# Patient Record
Sex: Female | Born: 1995 | Race: White | Hispanic: No | Smoking: Never smoker
Health system: Southern US, Community
[De-identification: ages and names within clinical notes are randomized; demographics above are authoritative.]

## PROBLEM LIST (undated history)

## (undated) DIAGNOSIS — F419 Anxiety disorder, unspecified: Secondary | ICD-10-CM

## (undated) DIAGNOSIS — F329 Major depressive disorder, single episode, unspecified: Secondary | ICD-10-CM

## (undated) DIAGNOSIS — K439 Ventral hernia without obstruction or gangrene: Secondary | ICD-10-CM

## (undated) DIAGNOSIS — G4711 Idiopathic hypersomnia with long sleep time: Principal | ICD-10-CM

## (undated) DIAGNOSIS — G43909 Migraine, unspecified, not intractable, without status migrainosus: Secondary | ICD-10-CM

## (undated) DIAGNOSIS — G4719 Other hypersomnia: Secondary | ICD-10-CM

## (undated) DIAGNOSIS — F319 Bipolar disorder, unspecified: Secondary | ICD-10-CM

## (undated) HISTORY — DX: Other hypersomnia: G47.19

## (undated) HISTORY — DX: Migraine, unspecified, not intractable, without status migrainosus: G43.909

## (undated) HISTORY — DX: Major depressive disorder, single episode, unspecified: F32.9

## (undated) HISTORY — DX: Anxiety disorder, unspecified: F41.9

## (undated) HISTORY — DX: Bipolar disorder, unspecified: F31.9

## (undated) HISTORY — DX: Idiopathic hypersomnia with long sleep time: G47.11

---

## 2013-10-10 ENCOUNTER — Emergency Department: Payer: Self-pay | Admitting: Emergency Medicine

## 2013-10-10 LAB — DRUG SCREEN, URINE
AMPHETAMINES, UR SCREEN: NEGATIVE (ref ?–1000)
BARBITURATES, UR SCREEN: NEGATIVE (ref ?–200)
Benzodiazepine, Ur Scrn: NEGATIVE (ref ?–200)
Cannabinoid 50 Ng, Ur ~~LOC~~: NEGATIVE (ref ?–50)
Cocaine Metabolite,Ur ~~LOC~~: NEGATIVE (ref ?–300)
MDMA (Ecstasy)Ur Screen: POSITIVE (ref ?–500)
Methadone, Ur Screen: NEGATIVE (ref ?–300)
Opiate, Ur Screen: NEGATIVE (ref ?–300)
Phencyclidine (PCP) Ur S: NEGATIVE (ref ?–25)
TRICYCLIC, UR SCREEN: NEGATIVE (ref ?–1000)

## 2013-10-10 LAB — URINALYSIS, COMPLETE
BACTERIA: NONE SEEN
Bilirubin,UR: NEGATIVE
GLUCOSE, UR: NEGATIVE mg/dL (ref 0–75)
Ketone: NEGATIVE
Leukocyte Esterase: NEGATIVE
Nitrite: NEGATIVE
PH: 6 (ref 4.5–8.0)
Protein: NEGATIVE
RBC,UR: 294 /HPF (ref 0–5)
Specific Gravity: 1.021 (ref 1.003–1.030)
Squamous Epithelial: 2
WBC UR: 3 /HPF (ref 0–5)

## 2013-10-10 LAB — COMPREHENSIVE METABOLIC PANEL
ALK PHOS: 44 U/L — AB
ANION GAP: 4 — AB (ref 7–16)
Albumin: 3.5 g/dL — ABNORMAL LOW (ref 3.8–5.6)
BILIRUBIN TOTAL: 0.5 mg/dL (ref 0.2–1.0)
BUN: 12 mg/dL (ref 7–18)
Calcium, Total: 8.8 mg/dL — ABNORMAL LOW (ref 9.0–10.7)
Chloride: 107 mmol/L (ref 98–107)
Co2: 29 mmol/L (ref 21–32)
Creatinine: 0.71 mg/dL (ref 0.60–1.30)
EGFR (African American): >60
GLUCOSE: 91 mg/dL (ref 65–99)
OSMOLALITY: 279 (ref 275–301)
POTASSIUM: 4.2 mmol/L (ref 3.5–5.1)
SGOT(AST): 27 U/L — ABNORMAL HIGH (ref 0–26)
SGPT (ALT): 18 U/L (ref 12–78)
SODIUM: 140 mmol/L (ref 136–145)
Total Protein: 7 g/dL (ref 6.4–8.6)

## 2013-10-10 LAB — ETHANOL
Ethanol %: <0.003 % (ref 0.000–0.080)
Ethanol: <3 mg/dL

## 2013-10-10 LAB — ACETAMINOPHEN LEVEL: Acetaminophen: <2 ug/mL

## 2013-10-10 LAB — CBC
HCT: 40.7 % (ref 35.0–47.0)
HGB: 14 g/dL (ref 12.0–16.0)
MCH: 32.8 pg (ref 26.0–34.0)
MCHC: 34.4 g/dL (ref 32.0–36.0)
MCV: 95 fL (ref 80–100)
PLATELETS: 269 10*3/uL (ref 150–440)
RBC: 4.27 10*6/uL (ref 3.80–5.20)
RDW: 13.2 % (ref 11.5–14.5)
WBC: 5.2 10*3/uL (ref 3.6–11.0)

## 2013-10-10 LAB — SALICYLATE LEVEL: Salicylates, Serum: <1.7 mg/dL

## 2014-08-02 ENCOUNTER — Encounter: Payer: Self-pay | Admitting: Neurology

## 2014-08-02 ENCOUNTER — Ambulatory Visit (INDEPENDENT_AMBULATORY_CARE_PROVIDER_SITE_OTHER): Payer: BLUE CROSS/BLUE SHIELD | Admitting: Neurology

## 2014-08-02 VITALS — BP 105/64 | HR 91 | Resp 12 | Ht 60.5 in | Wt 110.0 lb

## 2014-08-02 DIAGNOSIS — F329 Major depressive disorder, single episode, unspecified: Secondary | ICD-10-CM

## 2014-08-02 DIAGNOSIS — G4711 Idiopathic hypersomnia with long sleep time: Secondary | ICD-10-CM

## 2014-08-02 DIAGNOSIS — R5383 Other fatigue: Secondary | ICD-10-CM | POA: Insufficient documentation

## 2014-08-02 DIAGNOSIS — F3340 Major depressive disorder, recurrent, in remission, unspecified: Secondary | ICD-10-CM | POA: Insufficient documentation

## 2014-08-02 DIAGNOSIS — F32A Depression, unspecified: Secondary | ICD-10-CM

## 2014-08-02 DIAGNOSIS — G4719 Other hypersomnia: Secondary | ICD-10-CM

## 2014-08-02 HISTORY — DX: Other hypersomnia: G47.19

## 2014-08-02 HISTORY — DX: Depression, unspecified: F32.A

## 2014-08-02 HISTORY — DX: Idiopathic hypersomnia with long sleep time: G47.11

## 2014-08-02 NOTE — Patient Instructions (Signed)
Hypersomnia Hypersomnia usually brings recurrent episodes of excessive daytime sleepiness or prolonged nighttime sleep. It is different than feeling tired due to lack of or interrupted sleep at night. People with hypersomnia are compelled to nap repeatedly during the day. This is often at inappropriate times such as:  At work.  During a meal.  In conversation. These daytime naps usually provide no relief. This disorder typically affects adolescents and young adults. CAUSES  This condition may be caused by:  Another sleep disorder (such as narcolepsy or sleep apnea).  Dysfunction of the autonomic nervous system.  Drug or alcohol abuse.  A physical problem, such as:  A tumor.  Head trauma. This is damage caused by an accident.  Injury to the central nervous system.  Certain medications, or medicine withdrawal.  Medical conditions may contribute to the disorder, including:  Multiple sclerosis.  Depression.  Encephalitis.  Epilepsy.  Obesity.  Some people appear to have a genetic predisposition to this disorder. In others, there is no known cause. SYMPTOMS   Patients often have difficulty waking from a long sleep. They may feel dazed or confused.  Other symptoms may include:  Anxiety.  Increased irritation (inflammation).  Decreased energy.  Restlessness.  Slow thinking.  Slow speech.  Loss of appetite.  Hallucinations.  Memory difficulty.  Tremors, Tics.  Some patients lose the ability to function in family, social, occupational, or other settings. TREATMENT  Treatment is symptomatic in nature. Stimulants and other drugs may be used to treat this disorder. Changes in behavior may help. For example, avoid night work and social activities that delay bed time. Changes in diet may offer some relief. Patients should avoid alcohol and caffeine. PROGNOSIS  The likely outcome (prognosis) for persons with hypersomnia depends on the cause of the disorder.  The disorder itself is not life threatening. But it can have serious consequences. For example, automobile accidents can be caused by falling asleep while driving. The attacks usually continue indefinitely. Document Released: 05/24/2002 Document Revised: 08/26/2011 Document Reviewed: 04/27/2008 ExitCare Patient Information 2015 [employer]. This information is not intended to replace advice given to you by your health care provider. Make sure you discuss any questions you have with your health care provider.  

## 2014-08-02 NOTE — Progress Notes (Signed)
SLEEP MEDICINE CLINIC   Provider:  Larey Seat, M D  Referring Provider: Noemi Chapel, NP Primary Care Physician:  No primary care provider on file.  Chief Complaint  Patient presents with  . NP P[facility] Sleep consult (sleeping alot, tired all the time)    Rm 11, alone    HPI:  Elizabeth Velez is a 19 y.o. female seen here as a referral  from Dr. 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 She would describe herself as excessively daytime sleepy and fatigued.  She endorsed the Epworth Sleepiness Scale at 18 points the fatigue severity score at 41 points, and the depression scale at 7 points. Also she has been treated by Noemi Chapel for depression and anxiety she still feels a death interest in activities and that she often needs more sleep. She is sleepy in daytime. She also has a diagnosis of migraines, these can be slept off meaning that the migraines do not arise out of sleep or wake the patient from sleep. She only sporadically and not regularly takes caffeine and she is currently on 3 medications that can affect her sleep pattern. Prozac 30 minute times a day, Wellbutrin 100 mg per day and Abilify 2.1 mg per day she's also on a birth control pill.  The patient has struggled with depression since her junior year and at the same time was treated for depression. She reports that she usually has no trouble to go to sleep in the first place but sometimes wakes up in the early morning hours. It is also not typical for her to have difficulties to re-initiate sleep.  She is usually going to bed around 7:00 for the last couple of months. Her bed room is cool, quiet and dark.  She sleeps alone, no pet.   She will fall asleep very promptly at this early evening time. She will sleep through the night until 8 or 9 AM depending  on her classes and assignments was a day. If she sleeps through the night she would obtain more than 12 hours of sleep yet wakes up unrefreshed and unrestored. There is no nocturia and no known interruptions of sleep  at night. She will rely on an alarm to wake up,  she feels she would otherwise oversleep. In addition to these very long nocturnal sleep periods there are  also daytime naps. She reports taking naps at about every other day for duration of over 1 hour, usually at a time after lunch.this adds to 13 hours of sleep on an average day. The naps are not refreshing her , either.   The patient's mother has sleep apnea and restless leg syndrome, due to claustrophobia she is unable to tolerate CPAP. The patient reports a history of nightmares and still at times has nightmarish dreams. She is not sleep walking and had no night terrors in childhood that she could recall. She was never told that she snores, she does not wake up with a dry mouth in the morning, very rarely will she have a headache in the morning. She has never undergone tonsillectomy adenoidectomy sinus surgeries or neck surgeries.        Review of Systems: Out of a complete 14 system review, the patient complains of only the following symptoms, and all other reviewed systems are negative.  hypersomnia with prolonged sleep time.   Epworth score  18 , Fatigue severity score 41   , depression score 7 . No dream intrusions, vivid dreams, no sleep paralysis, no cataplexy.    History   Social History  . Marital Status: Single    Spouse Name: N/A  . Number of Children: 0  . Years of Education: college   Occupational History  . Student at [employer] History Main Topics  . Smoking status: Never Smoker   . Smokeless tobacco: Not on file  . Alcohol Use: No  . Drug Use: No  . Sexual Activity: Not on file   Other Topics Concern  . Not on file   Social History Narrative   Caffeine, sporadic    Family History    Problem Relation Age of Onset  . Alcohol abuse Paternal Aunt   . Depression Maternal Aunt   . Alzheimer's disease Maternal Grandmother     Past Medical History  Diagnosis Date  . Migraine   . Depression   . Anxiety     History reviewed. No pertinent past surgical history.  Current Outpatient Prescriptions  Medication Sig Dispense Refill  . ARIPiprazole (ABILIFY) 5 MG tablet Take 2.5 mg by mouth daily.    Marland Kitchen buPROPion (WELLBUTRIN) 100 MG tablet Take 100 mg by mouth daily.    Marland Kitchen desogestrel-ethinyl estradiol (APRI,EMOQUETTE,SOLIA) 0.15-30 MG-MCG tablet Take 1 tablet by mouth daily.    Marland Kitchen FLUoxetine (PROZAC) 10 MG capsule Take 30 mg by mouth daily.      No current facility-administered medications for this visit.    Allergies as of 08/02/2014 - Review Complete 08/02/2014  Allergen Reaction Noted  . Penicillins  08/02/2014  . Zithromax [azithromycin]  08/02/2014    Vitals: BP 105/64 mmHg  Pulse 91  Resp 12  Ht 5' 0.5" (1.537 m)  Wt 110 lb (49.896 kg)  BMI 21.12 kg/m2 Last Weight:  Wt Readings from Last 1 Encounters:  08/02/14 110 lb (49.896 kg)       Last Height:   Ht Readings from Last 1 Encounters:  08/02/14 5' 0.5" (1.537 m)    Physical exam:  General: The patient is awake, alert and appears not in acute distress. The patient is well groomed. Head: Normocephalic, atraumatic. Neck is supple. Mallampati 1 ,  neck circumference: 13 . Nasal airflow unrestricted , TMJ is not  evident . Retrognathia is seen. She wore retainers in childhood.  Cardiovascular:  Regular rate and rhythm, without  murmurs or carotid bruit, and without distended neck veins. Respiratory: Lungs are clear to auscultation. Skin:  Without evidence of edema, or rash Trunk:  normal posture.  Neurologic exam : The patient is awake and alert, oriented to place and time.   Memory subjective described as intact. There is a normal attention span & concentration ability.  Speech is fluent without  dysarthria, dysphonia or aphasia. Mood and affect are appropriate.  Cranial nerves: Pupils are equal and briskly reactive to light. Funduscopic exam without evidence of pallor or edema. Extraocular movements  in vertical and horizontal planes intact and without nystagmus. Visual fields by finger perimetry are intact. Hearing to finger rub intact.  Facial sensation intact to fine touch. Facial motor strength is symmetric and tongue and uvula move midline.  Motor exam:  Normal tone ,muscle bulk and symmetric ,strength in all extremities.  Sensory:  Fine touch, pinprick and vibration were tested in all extremities. Proprioception is normal. Reports numbness in the AM, on  ocassion.   Coordination: Rapid alternating movements in the fingers/hands is normal. Finger-to-nose maneuver normal without evidence of ataxia, dysmetria or tremor.   Gait and station: Patient walks without assistive device and is able unassisted to climb up to the exam table. Strength within normal limits.  Stance is stable and normal.  Romberg testing is negative.  Deep tendon reflexes: in the  upper and lower extremities are symmetric and intact.    Assessment:  After physical and neurologic examination, review of laboratory studies, imaging, neurophysiology testing and pre-existing records, assessment is   1) hypersomnia, at this time unexplained. Narcoleptic features are not present except for the very high Epworth score. The patient denied sleep paralysis, dream intrusion, cataplexy. Differential diagnosis is discussed as possible depression related hypersomnia, qualified as hypersomnia with prolonged sleep time. 2) rule out apnea or upper airway resistance he syndrome, anatomically this patient is not at risk for either of these. She has never been told that she snores and nobody has observed a witnessed any apneas, but she does live alone. 3) she reports no restless legs, she does not frequently wake up, but she does  have some nightmare content in her dreams which can also be depression related. Review of her medication shows several medications that are REM suppressant. Therefore an MS LT would not be indicated or not valid for this patient.   The patient was advised of the nature of the diagnosed sleep disorder , the treatment options and risks for general a health and wellness arising from not treating the condition. Visit duration was 40 minutes. More than 50%of the face to face time was used to answer questions, and get more detailed sleep history to help in establishing the differential diagnosis.   Plan:  Treatment plan and additional workup : I will invite the patient for a attended polysomnography to see her REM sleep phases and the relation to possible sleep arousals. In addition I want to make sure that it is not a perception of sleep that affects the patient. I will not follow this study was an MS LT. I will order an HLA test to establish a differential.  I thank Noemi Chapel for allowing me to participate in this pleasant patient's care, and encourage her to call me if there are any additional questions.  We are mainly planning to rulie out an organic sleep disorder at the origin of hypersomnia with prolonged sleep time.       Asencion Partridge Briscoe Daniello MD  08/02/2014

## 2014-08-10 ENCOUNTER — Telehealth: Payer: Self-pay | Admitting: Neurology

## 2014-08-10 NOTE — Telephone Encounter (Signed)
Negative for narcolepsy.

## 2014-08-10 NOTE — Telephone Encounter (Signed)
Pt is calling to get lab results.  Please call and advise. °

## 2014-08-10 NOTE — Telephone Encounter (Signed)
Called patient and spoke to her and told her normal labs. Patient understood Narcolepsy.

## 2014-08-29 ENCOUNTER — Ambulatory Visit (INDEPENDENT_AMBULATORY_CARE_PROVIDER_SITE_OTHER): Payer: BLUE CROSS/BLUE SHIELD | Admitting: Neurology

## 2014-08-29 ENCOUNTER — Telehealth: Payer: Self-pay | Admitting: Neurology

## 2014-08-29 DIAGNOSIS — G4719 Other hypersomnia: Secondary | ICD-10-CM

## 2014-08-29 DIAGNOSIS — G4710 Hypersomnia, unspecified: Secondary | ICD-10-CM

## 2014-08-29 DIAGNOSIS — G4711 Idiopathic hypersomnia with long sleep time: Secondary | ICD-10-CM

## 2014-08-29 DIAGNOSIS — F329 Major depressive disorder, single episode, unspecified: Secondary | ICD-10-CM

## 2014-08-29 DIAGNOSIS — F32A Depression, unspecified: Secondary | ICD-10-CM

## 2014-08-29 NOTE — Telephone Encounter (Signed)
Called and left VM for return call back for appointment confirmation of tonight's study.

## 2014-08-29 NOTE — Sleep Study (Signed)
Please see the scanned sleep study interpretation located in the Procedure tab within the Chart Review section. 

## 2014-09-07 ENCOUNTER — Telehealth: Payer: Self-pay | Admitting: *Deleted

## 2014-09-07 ENCOUNTER — Encounter: Payer: Self-pay | Admitting: Neurology

## 2014-09-07 NOTE — Telephone Encounter (Signed)
Patient was contacted and provided the results of her sleep study.  Elizabeth SavageLisa Poulos was faxed a copy of the report.  Patient was informed that no further follow up was needed in the sleep clinic.

## 2014-10-08 NOTE — Consult Note (Signed)
PATIENT NAME:  Elizabeth Velez, Elizabeth Velez MR#:  [id number] DATE OF BIRTH:  03-04-1996  DATE OF CONSULTATION:  10/10/2013  REFERRING PHYSICIAN:   CONSULTING PHYSICIAN:  Elizabeth Dicenso B. Jennet Maduro, MD  REFERRING PHYSICIAN: Janalyn Harder, MD   CONSULTING PHYSICIAN: Elizabeth Linea, MD   REASON FOR CONSULTATION: To evaluate a suicidal patient.   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   CHIEF COMPLAINT: "I lost it."   HISTORY OF PRESENT ILLNESS: The patient has been in the care of [facility] psychiatrist and [facility].  She felt that she was stable on her current regimen of medications. She came to the Emergency Room with a roommate complaining of suicidal thoughts with a plan to overdose on medication. She specifically named Excedrin to overdose on. This morning, she broke up with her boyfriend. This is the second relationship that she lost this year. She feels sad, discouraged, worthless, hopeless. She feels inadequate, guilty. Apparently, the reason why she lost this boyfriend is that she is not a drinker. She lives in a substance free dorm together with 7 other kids on [place] campus. She does not fit socially.  She did not get to sorority. She is unable to maintain a romantic relationship. She thinks that it is all due to the fact that she is not engaging in regular campus activities, mostly substance use. She does well in school. She is in early education class. She is a Medical laboratory scientific officer. Grades are excellent, but she does not fit into campus community. She still likes the academic program and is determined to continue. She denies psychotic symptoms. Denies excessive anxiety, denies symptoms suggestive of bipolar mania. As above, no substance use or alcohol.   PAST PSYCHIATRIC HISTORY: She has never been hospitalized. No suicide attempts. She has an outpatient psychiatrist.   FAMILY PSYCHIATRIC HISTORY: Mother with depression, anxiety, and history  of suicide attempts.   PAST MEDICAL HISTORY: None.   ALLERGIES: No known drug allergies.   MEDICATIONS ON ADMISSION: Prozac, Wellbutrin,  Xanax. I am uncertain of her dosage.   SOCIAL HISTORY: As above, she is from [place]. She lives on campus, is a sophomore at [employer]. She has an older sister who studies at the [employer].   REVIEW OF SYSTEMS:  CONSTITUTIONAL: No fevers or chills. No weight changes.  EYES: No double or blurred vision. EARS, NOSE, AND THROAT:  No hearing loss.  RESPIRATORY: No shortness of breath or cough.  CARDIOVASCULAR: No chest pain or orthopnea.  GASTROINTESTINAL: No abdominal pain, nausea, vomiting, or diarrhea.  GENITOURINARY: No incontinence or frequency.  ENDOCRINE: No heat or cold intolerance.  LYMPHATIC: No anemia or easy bruising.  INTEGUMENTARY: No acne or rash.  MUSCULOSKELETAL: No muscle or joint pain.  NEUROLOGIC: No tingling or weakness.  PSYCHIATRIC: See history of present illness for details.   PHYSICAL EXAMINATION:  VITAL SIGNS: Blood pressure 111/55, pulse 92, respirations 16, temperature 98.5.  GENERAL: This is a petite, young girl, looking younger than stated age, tearful. The rest of the physical examination is deferred to her primary attending.   LABORATORY DATA: Chemistries are within normal limits. Blood alcohol level is zero. LFTs within normal limits, except for AST of 27. Urine tox screen negative for substances. CBC within normal limits. Urinalysis is not suggestive of urinary tract infection. Serum acetaminophen and salicylates are low. Urine pregnancy test is negative.   MENTAL STATUS EXAMINATION ON ADMISSION: The patient is examined in the hallway of Emergency  Room. She is alert and oriented to person, place, time, and situation. She is pleasant, polite, and cooperative. Rather tearful and timid. She maintains good eye contact. Her speech is soft. Mood is depressed with sad affect. She is well groomed and casually  dressed. Thought process is logical and goal oriented. She endorses thoughts of hurting herself, but now she feels that the danger is over as she had a plan to overdose on medications. There are no thoughts of hurting others. There are no delusions or paranoia. There are no auditory or visual hallucinations. Her cognition is grossly intact. Registration  record showed, short-term and long-term memory are good. She is a good historian. She is quite intelligent with good fund of knowledge. Her insight and judgment are good.   DIAGNOSES:  AXIS I: Major depressive disorder, recurrent, severe. Anxiety disorder, not otherwise specified.  AXIS II: Deferred.  AXIS III: Deferred.  AXIS IV: Relationship problems, social problems, primary support.  AXIS V: Global assessment of functioning 35.   PLAN: I do not want to discharge this young woman back to campus.  Her only support at this point in the evening is her roommate.  I do not think that this is fair to burden her friend with such responsibility.  The parents are in [place] and they both are to come.   I would like to admit this patient to psychiatry briefly but we have no beds.  We will place this patient in behavioral medicine unit in the Emergency Room for the night. I will re-evaluate the patient in the morning. We will hope that we will get in touch with her counselor on [facility] and that she would be discharged to care of her mental health professional. I believe that she will be able to return to school immediately. We will consult with the [place] authorities hours.  Our intake nurse will contact the mother. We have her phone number. I will probably prescribe trazodone for sleep tonight. I am uncertain of the doses of her medications. We will hold it for now. She can restart it in the morning. Page me if there is a problem.    ____________________________ Elizabeth Goodie Jennet Maduro, MD jbp:dd D: 10/10/2013 20:43:25 ET T: 10/11/2013 04:21:17  ET JOB#: [id number]  cc: Elizabeth Mcquiston B. Jennet Maduro, MD, <Dictator> Shari Prows MD ELECTRONICALLY SIGNED 10/13/2013 23:51

## 2014-10-08 NOTE — Consult Note (Signed)
Brief Consult Note: Diagnosis: MDD recurrent.   Patient was seen by consultant.   Consult note dictated.   Recommend further assessment or treatment.   Orders entered.   Discussed with Attending MD.   Comments: [name] has a h/o depression and anxiety here for suicidal thoughts after brake up with her BF of 1.5 months.  PLAN: 1. No beds in BMU.  2. Will admit to BHU overnight, reassess in the morning, [facility]  3. Intake nurse will call the mother, Elizabeth Velez.  4. will give trazodone for sleep.  Electronic Signatures for Addendum Section:  Kristine LineaPucilowska, Dwana Garin (MD) (Signed Addendum 26-Apr-15 22:48)  Intake nurse spoke with the mother who is comfortable with discharging Elizabeth Velez back to campus. [facility] administrator on call will pick the patient up fro ER. She will spend the night with her roommate. She will see [facility] therapist on Monday and [facility] psychiatrist on Thursday.   Electronic Signatures: Kristine LineaPucilowska, Ramel Tobon (MD)  (Signed 26-Apr-15 20:46)  Authored: Brief Consult Note   Last Updated: 26-Apr-15 22:48 by Kristine LineaPucilowska, Donita Newland (MD)

## 2015-03-29 ENCOUNTER — Ambulatory Visit (INDEPENDENT_AMBULATORY_CARE_PROVIDER_SITE_OTHER): Payer: 59 | Admitting: Family Medicine

## 2015-03-29 ENCOUNTER — Encounter: Payer: Self-pay | Admitting: Family Medicine

## 2015-03-29 VITALS — BP 102/72 | HR 104 | Temp 98.4°F | Resp 18 | Wt 112.3 lb

## 2015-03-29 DIAGNOSIS — Z13220 Encounter for screening for lipoid disorders: Secondary | ICD-10-CM

## 2015-03-29 DIAGNOSIS — R5383 Other fatigue: Secondary | ICD-10-CM | POA: Diagnosis not present

## 2015-03-29 DIAGNOSIS — R010 Benign and innocent cardiac murmurs: Secondary | ICD-10-CM

## 2015-03-29 DIAGNOSIS — Z3041 Encounter for surveillance of contraceptive pills: Secondary | ICD-10-CM

## 2015-03-29 DIAGNOSIS — Z136 Encounter for screening for cardiovascular disorders: Secondary | ICD-10-CM | POA: Diagnosis not present

## 2015-03-29 DIAGNOSIS — F3340 Major depressive disorder, recurrent, in remission, unspecified: Secondary | ICD-10-CM

## 2015-03-29 DIAGNOSIS — R011 Cardiac murmur, unspecified: Secondary | ICD-10-CM

## 2015-03-29 DIAGNOSIS — IMO0001 Reserved for inherently not codable concepts without codable children: Secondary | ICD-10-CM | POA: Insufficient documentation

## 2015-03-29 MED ORDER — DESOGESTREL-ETHINYL ESTRADIOL 0.15-30 MG-MCG PO TABS: 1.0000 | ORAL_TABLET | Freq: Every day | ORAL | Status: DC

## 2015-03-29 NOTE — Progress Notes (Signed)
Name: Elizabeth Velez   MRN: [id number]    DOB: Jun 04, 1996   Date:03/29/2015       Progress Note  Subjective  Chief Complaint  Chief Complaint  Patient presents with  . Establish Care  . Labs Only    patient is fasting and has not had any recent blood work  . Medication Refill    birth control  . Irregular Heart Beat    [facility] that she had "an extra flow at the end"    HPI  Elizabeth Velez is a 19 y.o. female here today to transition care of medical needs to a primary care provider. [facility] for acute symptoms of headache and fever on 03/08/15 and was told she had "an extra blood flow" on her cardiac exam. Her heart rate was 95 that day and blood pressure was 108/72 mmHG, temperature 98 degrees F. SPO2 98%, resp rate 18.  She denies palpitations, chest pain, new syncope, nausea, vomiting, worsening dyspnea, edema, dizziness, previous history of cardiac abnormalities. Her acute symptoms of headache and fever have since resolved.  She is also using birth control pills for contraceptive method, prevention of pregnancy and regulation of menses. Denies any side effects.   Patient has a history of depression. Symptoms in the past have been  depressed mood, difficulty concentrating, fatigue, feelings of worthlessness/guilt, hopelessness and insomnia. Onset was approximately several years ago, stable since that time.  She denies current suicidal and homicidal plan or intent.   Family history significant for anxiety and depression.Possible organic causes contributing are: none.  Risk factors: none. Current treatment includes Prozac 10 mg taking 3 tablets plus Rexulti 1 mg at bedtime and Wellbutrin SR 100 MG in the morning only (she had previously tried going up on the dose and it did not suit her well) and individual therapy.  She is requesting referral to a new therapist. She sees a Therapist, sports in [place] already. She  complains of the following side effects from the treatment: none.     Past Medical History  Diagnosis Date  . Migraine   . Depression   . Anxiety   . 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 Excessive daytime sleepiness 08/02/2014  . Depression 08/02/2014    Patient Active Problem List   Diagnosis Date Noted  . 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 Excessive daytime sleepiness 08/02/2014  . Depression 08/02/2014    Social History  Substance Use Topics  . Smoking status: Never Smoker   . Smokeless tobacco: Not on file  . Alcohol Use: 0.0 oz/week    0 Standard drinks or equivalent per week     Comment: only once a month     Current outpatient prescriptions:  .  Biotin 1000 MCG tablet, Take 1,000 mcg by mouth daily., Disp: , Rfl:  .  buPROPion (WELLBUTRIN) 100 MG tablet, Take 100 mg by mouth daily., Disp: , Rfl:  .  desogestrel-ethinyl estradiol (APRI,EMOQUETTE,SOLIA) 0.15-30 MG-MCG tablet, Take 1 tablet by mouth daily., Disp: , Rfl:  .  FLUoxetine (PROZAC) 10 MG capsule, Take 30 mg by mouth daily. , Disp: , Rfl:  .  Melatonin 10 MG TABS, Take 10 mg by mouth as needed., Disp: , Rfl:  .  REXULTI 1 MG TABS, Take 1 tablet by mouth at bedtime., Disp: , Rfl: 0  History reviewed. No pertinent past surgical history.  Family History  Problem Relation Age of Onset  . Alcohol abuse Paternal Aunt   . Depression Maternal Aunt   . Alzheimer's disease Maternal Grandmother   . Heart disease Maternal Grandfather     Allergies  Allergen Reactions  . Penicillins   . Zithromax [Azithromycin]      Review of Systems  CONSTITUTIONAL: No significant weight changes, fever, chills, weakness or fatigue.  HEENT:  - Eyes: No visual changes.  - Ears: No auditory changes. No pain.  - Nose: No sneezing, congestion, runny nose. - Throat: No sore throat. No changes in swallowing. SKIN: No rash or itching.  CARDIOVASCULAR: No chest pain, chest pressure  or chest discomfort. No palpitations or edema.  RESPIRATORY: No shortness of breath, cough or sputum.  GASTROINTESTINAL: No anorexia, nausea, vomiting. No changes in bowel habits. No abdominal pain or blood.  GENITOURINARY: No dysuria. No frequency. No discharge.  NEUROLOGICAL: No headache, dizziness, syncope, paralysis, ataxia, numbness or tingling in the extremities. No memory changes. No change in bowel or bladder control.  MUSCULOSKELETAL: No joint pain. No muscle pain. HEMATOLOGIC: No anemia, bleeding or bruising.  LYMPHATICS: No enlarged lymph nodes.  PSYCHIATRIC: No change in mood. No change in sleep pattern.  ENDOCRINOLOGIC: No reports of sweating, cold or heat intolerance. No polyuria or polydipsia.     Objective  BP 102/72 mmHg  Pulse 104  Temp(Src) 98.4 F (36.9 C) (Oral)  Resp 18  Wt 112 lb 4.8 oz (50.939 kg)  SpO2 96%  LMP 03/23/2015 (Approximate) Body mass index is 21.56 kg/(m^2).  Physical Exam  Constitutional: Patient appears well-developed and well-nourished. In no distress.  HEENT:  - Head: Normocephalic and atraumatic.  - Ears: Bilateral TMs gray, no erythema or effusion - Nose: Nasal mucosa moist - Mouth/Throat: Oropharynx is clear and moist. No tonsillar hypertrophy or erythema. No post nasal drainage.  - Eyes: Conjunctivae clear, EOM movements normal. PERRLA. No scleral icterus.  Neck: Normal range of motion. Neck supple. No JVD present. No thyromegaly present.  Cardiovascular: Normal rate, regular rhythm and normal heart sounds.  No murmur heard.  Pulmonary/Chest: Effort normal and breath sounds normal. No respiratory distress. Musculoskeletal: Normal range of motion bilateral UE and LE, no joint effusions. Peripheral vascular: Bilateral LE no edema. Neurological: CN II-XII grossly intact with no focal deficits. Alert and oriented to person, place, and time. Coordination, balance, strength, speech and gait are normal.  Skin: Skin is warm and dry. No rash  noted. No erythema.  Psychiatric: Patient has a normal mood and affect. Behavior is normal in office today. Judgment and thought content normal in office today.   Assessment & Plan  1. Heart murmur previously undiagnosed Not heard on exam today, EKG shows normal sinus rhythm with no stand out abberations.   - EKG 12-Lead - CBC with Differential/Platelet - Comprehensive metabolic panel - Lipid panel - TSH - Magnesium  2. Major depressive disorder, recurrent, in remission (HCC) Well controled.  - Ambulatory referral to Psychology  3. Other fatigue Likely due to mood disorder. Young female, good BMI, no abnormal physical findings. Will get blood work.  - CBC with Differential/Platelet - Comprehensive metabolic panel - Lipid panel - TSH - Magnesium - Ambulatory referral to Psychology  4. Encounter for cholesteral screening for cardiovascular disease  - Lipid panel  5. Oral contraceptive pill surveillance Patient has been counseled on birth control options today. We discussed risks and benefits of each available contraception. Counseled on risk for STDs not reduced by birth control use. Decided  on continuing oral contraceptives. The patient has been counseled on the proper usage, side effects and potential interactions of the new medication.  - desogestrel-ethinyl estradiol (APRI,EMOQUETTE,SOLIA) 0.15-30 MG-MCG tablet; Take 1 tablet by mouth daily.  Dispense: 3 Package; Refill: 3

## 2015-03-30 LAB — COMPREHENSIVE METABOLIC PANEL
ALT: 18 IU/L (ref 0–32)
AST: 25 IU/L (ref 0–40)
Albumin/Globulin Ratio: 1.7 (ref 1.1–2.5)
Albumin: 4.2 g/dL (ref 3.5–5.5)
Alkaline Phosphatase: 43 IU/L (ref 39–117)
BUN/Creatinine Ratio: 15 (ref 8–20)
BUN: 11 mg/dL (ref 6–20)
Bilirubin Total: 0.5 mg/dL (ref 0.0–1.2)
CO2: 23 mmol/L (ref 18–29)
CREATININE: 0.74 mg/dL (ref 0.57–1.00)
Calcium: 9.1 mg/dL (ref 8.7–10.2)
Chloride: 101 mmol/L (ref 97–108)
GFR calc Af Amer: 134 mL/min/{1.73_m2} (ref >59)
GFR calc non Af Amer: 116 mL/min/{1.73_m2} (ref >59)
GLOBULIN, TOTAL: 2.5 g/dL (ref 1.5–4.5)
GLUCOSE: 83 mg/dL (ref 65–99)
Potassium: 4.2 mmol/L (ref 3.5–5.2)
Sodium: 140 mmol/L (ref 134–144)
Total Protein: 6.7 g/dL (ref 6.0–8.5)

## 2015-03-30 LAB — CBC WITH DIFFERENTIAL/PLATELET
BASOS: 0 %
Basophils Absolute: 0 10*3/uL (ref 0.0–0.2)
EOS (ABSOLUTE): 0.1 10*3/uL (ref 0.0–0.4)
EOS: 1 %
HEMOGLOBIN: 14.1 g/dL (ref 11.1–15.9)
Hematocrit: 39.6 % (ref 34.0–46.6)
IMMATURE GRANULOCYTES: 0 %
Immature Grans (Abs): 0 10*3/uL (ref 0.0–0.1)
Lymphocytes Absolute: 1.5 10*3/uL (ref 0.7–3.1)
Lymphs: 39 %
MCH: 32.9 pg (ref 26.6–33.0)
MCHC: 35.6 g/dL (ref 31.5–35.7)
MCV: 93 fL (ref 79–97)
MONOCYTES: 7 %
Monocytes Absolute: 0.3 10*3/uL (ref 0.1–0.9)
NEUTROS ABS: 2 10*3/uL (ref 1.4–7.0)
NEUTROS PCT: 53 %
Platelets: 250 10*3/uL (ref 150–379)
RBC: 4.28 x10E6/uL (ref 3.77–5.28)
RDW: 12.4 % (ref 12.3–15.4)
WBC: 3.8 10*3/uL (ref 3.4–10.8)

## 2015-03-30 LAB — LIPID PANEL
CHOLESTEROL TOTAL: 147 mg/dL (ref 100–199)
Chol/HDL Ratio: 2.7 ratio units (ref 0.0–4.4)
HDL: 55 mg/dL (ref >39)
LDL CALC: 80 mg/dL (ref 0–99)
TRIGLYCERIDES: 61 mg/dL (ref 0–149)
VLDL Cholesterol Cal: 12 mg/dL (ref 5–40)

## 2015-03-30 LAB — TSH: TSH: 1.83 u[IU]/mL (ref 0.450–4.500)

## 2015-03-30 LAB — MAGNESIUM: MAGNESIUM: 2.1 mg/dL (ref 1.6–2.3)

## 2015-03-31 ENCOUNTER — Telehealth: Payer: Self-pay

## 2015-03-31 ENCOUNTER — Telehealth: Payer: Self-pay | Admitting: Family Medicine

## 2015-03-31 NOTE — Telephone Encounter (Signed)
Pt returning your call about her lab results.

## 2015-03-31 NOTE — Telephone Encounter (Signed)
Tried to contact this patient to review her labs, but there was no answer. A message was left for her to give us a call when she got the chance.

## 2015-03-31 NOTE — Telephone Encounter (Signed)
Patient mom called due to this patient not being able to answer her phone from poor connection. After she verified this patient's date of birth, normal labs were reviewed. Mom was told that a referral was placed for a evaluation from a psychologist and then she informed me that an appt has already been made.

## 2015-03-31 NOTE — Telephone Encounter (Signed)
Patient was informed via mom.

## 2015-10-27 ENCOUNTER — Ambulatory Visit (INDEPENDENT_AMBULATORY_CARE_PROVIDER_SITE_OTHER): Payer: 59 | Admitting: Family Medicine

## 2015-10-27 ENCOUNTER — Encounter: Payer: Self-pay | Admitting: Family Medicine

## 2015-10-27 VITALS — BP 104/76 | HR 98 | Temp 98.7°F | Resp 14 | Wt 106.4 lb

## 2015-10-27 DIAGNOSIS — R05 Cough: Secondary | ICD-10-CM

## 2015-10-27 DIAGNOSIS — J069 Acute upper respiratory infection, unspecified: Secondary | ICD-10-CM | POA: Diagnosis not present

## 2015-10-27 DIAGNOSIS — R059 Cough, unspecified: Secondary | ICD-10-CM

## 2015-10-27 DIAGNOSIS — J0110 Acute frontal sinusitis, unspecified: Secondary | ICD-10-CM

## 2015-10-27 DIAGNOSIS — H6981 Other specified disorders of Eustachian tube, right ear: Secondary | ICD-10-CM | POA: Diagnosis not present

## 2015-10-27 MED ORDER — DOXYCYCLINE HYCLATE 100 MG PO TABS: 100.0000 mg | ORAL_TABLET | Freq: Two times a day (BID) | ORAL | Status: AC

## 2015-10-27 MED ORDER — BENZONATATE 100 MG PO CAPS: 100.0000 mg | ORAL_CAPSULE | Freq: Three times a day (TID) | ORAL | Status: AC | PRN

## 2015-10-27 NOTE — Progress Notes (Signed)
BP 104/76 mmHg  Pulse 98  Temp(Src) 98.7 F (37.1 C) (Oral)  Resp 14  Wt 106 lb 6.4 oz (48.263 kg)  SpO2 96%  LMP 10/11/2015 (Approximate)   Subjective:    Patient ID: Elizabeth Velez, female    DOB: July 21, 1995, 20 y.o.   MRN: [id number]  HPI: Elizabeth Velez is a 20 y.o. female  Chief Complaint  Patient presents with  . URI    onset 10 day symptoms include: congestion, cough, ear fullness, headache, facial pressure/pain.  Has tried otc sudafed   New patient to me Has been sick for 2 weeks Went to [facility] and they tested for strep and it was negative Right ear is full, uncomfortable Went into the head; can't really blow anything out; voice is a little changed Having headaches She has migraines, but especially yesterday, really front and dizzy Spot on the back of the right thigh; red and some itchiness; only rash Took delsym last night; slept for only 3 hours  Depression screen [facility] 2/9 10/27/2015 03/29/2015  Decreased Interest 0 2  Down, Depressed, Hopeless 0 2  PHQ - 2 Score 0 4  Altered sleeping - 3  Tired, decreased energy - 3  Change in appetite - 2  Feeling bad or failure about yourself  - 1  Trouble concentrating - 0  Moving slowly or fidgety/restless - 0  Suicidal thoughts - 1  PHQ-9 Score - 14  Difficult doing work/chores - Very difficult   Relevant past medical, social history reviewed Past Medical History  Diagnosis Date  . Migraine   . Depression   . Anxiety   . 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 Excessive daytime sleepiness 08/02/2014  . Depression 08/02/2014  . Bipolar 1 disorder, depressed (HCC)    No past surgical history on file.   Social History  Substance Use Topics  . Smoking status: Never Smoker   . Smokeless tobacco: None  . Alcohol Use: 0.0 oz/week    0 Standard drinks or equivalent per week     Comment: only once a month   Interim medical history since last visit reviewed. Allergies and medications  reviewed  Review of Systems Per HPI unless specifically indicated above     Objective:    BP 104/76 mmHg  Pulse 98  Temp(Src) 98.7 F (37.1 C) (Oral)  Resp 14  Wt 106 lb 6.4 oz (48.263 kg)  SpO2 96%  LMP 10/11/2015 (Approximate)  Wt Readings from Last 3 Encounters:  10/27/15 106 lb 6.4 oz (48.263 kg)  03/29/15 112 lb 4.8 oz (50.939 kg)  08/02/14 110 lb (49.896 kg)    Physical Exam  Constitutional: She appears well-developed and well-nourished.  HENT:  Right Ear: Hearing, external ear and ear canal normal. Tympanic membrane is not erythematous. A middle ear effusion is present.  Left Ear: Hearing, tympanic membrane, external ear and ear canal normal. Tympanic membrane is not erythematous.  No middle ear effusion.  Nose: No mucosal edema (but tissues are erythematous) or rhinorrhea. Right sinus exhibits maxillary sinus tenderness. Right sinus exhibits no frontal sinus tenderness. Left sinus exhibits frontal sinus tenderness. Left sinus exhibits no maxillary sinus tenderness.  Mouth/Throat: Mucous membranes are normal. Posterior oropharyngeal erythema present. No oropharyngeal exudate or posterior oropharyngeal edema.  Eyes: EOM are normal. Right eye exhibits no exudate. Left eye exhibits no exudate. No scleral icterus.  Cardiovascular: Normal rate and regular rhythm.   Pulmonary/Chest: Effort normal and breath sounds normal.  Lymphadenopathy:  She has cervical adenopathy (shoddy).  Psychiatric: She has a normal mood and affect. Her behavior is normal.       Assessment & Plan:   Problem List Items Addressed This Visit    None    Visit Diagnoses    Acute non-recurrent frontal sinusitis    -  Primary    start doxy; suspect secondary bacterial infection following viral illness; cautioned abour risk of antibiotics and OCPs; C dif precautions discussed; see AVS    ETD (eustachian tube dysfunction), right        secondary to infection; should resolve spontaneously    Upper  respiratory infection        I believe this was viral initially, and now she has foci of secondary bacterial infection; rest, hydration    Cough        tessalon perles prescribed       Follow up plan: No Follow-up on file.--PRN  An after-visit summary was printed and given to the patient at check-out.  Please see the patient instructions which may contain other information and recommendations beyond what is mentioned above in the assessment and plan.  Meds ordered this encounter  Medications  . lamoTRIgine (LAMICTAL) 100 MG tablet    Sig: Take 100 mg by mouth daily.    Refill:  3  . FLUoxetine (PROZAC) 40 MG capsule    Sig: Take 40 mg by mouth every morning.    Refill:  3  . doxycycline (VIBRA-TABS) 100 MG tablet    Sig: Take 1 tablet (100 mg total) by mouth 2 (two) times daily.    Dispense:  20 tablet    Refill:  0  . benzonatate (TESSALON PERLES) 100 MG capsule    Sig: Take 1 capsule (100 mg total) by mouth every 8 (eight) hours as needed for cough.    Dispense:  30 capsule    Refill:  0

## 2015-10-27 NOTE — Patient Instructions (Signed)
Try vitamin C (orange juice if not diabetic or vitamin C tablets) and drink green tea to help your immune system during your illness Get plenty of rest and hydration Please do eat yogurt daily or take a probiotic daily for the next month or two We want to replace the healthy germs in the gut If you notice foul, watery diarrhea in the next two months, schedule an appointment RIGHT AWAY Start the antibiotics Use the tessalon perles if needed Back-up method -- caution! Call if needed Carry the benadryl

## 2015-12-08 ENCOUNTER — Ambulatory Visit (INDEPENDENT_AMBULATORY_CARE_PROVIDER_SITE_OTHER): Payer: 59 | Admitting: Family Medicine

## 2015-12-08 ENCOUNTER — Encounter: Payer: Self-pay | Admitting: Family Medicine

## 2015-12-08 VITALS — BP 106/72 | HR 98 | Temp 98.0°F | Resp 16 | Wt 108.7 lb

## 2015-12-08 DIAGNOSIS — J029 Acute pharyngitis, unspecified: Secondary | ICD-10-CM

## 2015-12-08 DIAGNOSIS — R5382 Chronic fatigue, unspecified: Secondary | ICD-10-CM | POA: Diagnosis not present

## 2015-12-08 DIAGNOSIS — R61 Generalized hyperhidrosis: Secondary | ICD-10-CM

## 2015-12-08 DIAGNOSIS — Z114 Encounter for screening for human immunodeficiency virus [HIV]: Secondary | ICD-10-CM | POA: Diagnosis not present

## 2015-12-08 DIAGNOSIS — R252 Cramp and spasm: Secondary | ICD-10-CM | POA: Diagnosis not present

## 2015-12-08 DIAGNOSIS — K529 Noninfective gastroenteritis and colitis, unspecified: Secondary | ICD-10-CM | POA: Diagnosis not present

## 2015-12-08 LAB — CBC WITH DIFFERENTIAL/PLATELET
BASOS ABS: 0 {cells}/uL (ref 0–200)
Basophils Relative: 0 %
EOS ABS: 68 {cells}/uL (ref 15–500)
Eosinophils Relative: 1 %
HEMATOCRIT: 41.8 % (ref 35.0–45.0)
Hemoglobin: 14.1 g/dL (ref 11.7–15.5)
LYMPHS ABS: 1428 {cells}/uL (ref 850–3900)
Lymphocytes Relative: 21 %
MCH: 32.2 pg (ref 27.0–33.0)
MCHC: 33.7 g/dL (ref 32.0–36.0)
MCV: 95.4 fL (ref 80.0–100.0)
MONOS PCT: 3 %
MPV: 8.8 fL (ref 7.5–12.5)
Monocytes Absolute: 204 cells/uL (ref 200–950)
NEUTROS ABS: 5100 {cells}/uL (ref 1500–7800)
Neutrophils Relative %: 75 %
PLATELETS: 239 10*3/uL (ref 140–400)
RBC: 4.38 MIL/uL (ref 3.80–5.10)
RDW: 12.7 % (ref 11.0–15.0)
WBC: 6.8 10*3/uL (ref 3.8–10.8)

## 2015-12-08 LAB — COMPLETE METABOLIC PANEL WITH GFR
ALBUMIN: 3.9 g/dL (ref 3.6–5.1)
ALK PHOS: 38 U/L (ref 33–115)
ALT: 12 U/L (ref 6–29)
AST: 18 U/L (ref 10–30)
BILIRUBIN TOTAL: 0.5 mg/dL (ref 0.2–1.2)
BUN: 9 mg/dL (ref 7–25)
CALCIUM: 8.9 mg/dL (ref 8.6–10.2)
CO2: 26 mmol/L (ref 20–31)
CREATININE: 0.68 mg/dL (ref 0.50–1.10)
Chloride: 102 mmol/L (ref 98–110)
GFR, Est Non African American: >89 mL/min (ref >=60)
Glucose, Bld: 89 mg/dL (ref 65–99)
Potassium: 4.4 mmol/L (ref 3.5–5.3)
Sodium: 138 mmol/L (ref 135–146)
TOTAL PROTEIN: 6.5 g/dL (ref 6.1–8.1)

## 2015-12-08 LAB — MAGNESIUM: Magnesium: 1.9 mg/dL (ref 1.5–2.5)

## 2015-12-08 LAB — TSH: TSH: 1.3 mIU/L

## 2015-12-08 LAB — LACTATE DEHYDROGENASE: LDH: 114 U/L (ref 94–250)

## 2015-12-08 MED ORDER — ONDANSETRON 4 MG PO TBDP: 4.0000 mg | ORAL_TABLET | Freq: Three times a day (TID) | ORAL | Status: DC | PRN

## 2015-12-08 NOTE — Assessment & Plan Note (Addendum)
Better but still fatigued; ordering labs

## 2015-12-08 NOTE — Progress Notes (Signed)
BP 106/72 mmHg  Pulse 98  Temp(Src) 98 F (36.7 C) (Oral)  Resp 16  Wt 108 lb 11.2 oz (49.306 kg)  SpO2 97%  LMP 12/01/2015 (Approximate)   Subjective:    Patient ID: Elizabeth Velez, female    DOB: 04-23-1996, 20 y.o.   MRN: [id number]  HPI: Elizabeth Velez is a 20 y.o. female  Chief Complaint  Patient presents with  . Night Sweats  . Emesis    GI upset   Patient is new to me; her previous provider here left the practice  Just started to get sick last night around 2 am; no travel; just nausea and vomiting; no blood; no diarrhea; no suspect food; boyfriend sick, but not vomiting, more sinus issues; she has been taking tums and pepto-bismol; little bit of abdominal pain, but also has period; no chance she is pregnant she says  Night sweats was original reason for appt; going on for a long time; she has been on prozac since junior year of high school; her doctor wanted to wean her off of prozac because SSRIs often cause night sweats; last time she saw him he cut the dosage in half a month ago; she saw him yesterday too; she sweats through her shirt and has to change shirts; no odd smell; no weight loss; diagnosed with bipolar disorder, went through some depression but doing better; depression runs in the family  When she first started coming here, had some labs, nothing came up; we reviewed labs; she had a narcolepsy panel; sleeping a lot; better now on the medicine; denied ecstacy Was tested for mono at [facility], tested for lyme at [facility] too, both negative  Depression screen [facility] 2/9 12/08/2015 10/27/2015 03/29/2015  Decreased Interest 0 0 2  Down, Depressed, Hopeless 1 0 2  PHQ - 2 Score 1 0 4  Altered sleeping - - 3  Tired, decreased energy - - 3  Change in appetite - - 2  Feeling bad or failure about yourself  - - 1  Trouble concentrating - - 0  Moving slowly or fidgety/restless - - 0  Suicidal thoughts - - 1  PHQ-9 Score - - 14  Difficult doing work/chores - - Very  difficult   Relevant past medical, surgical, family and social history reviewed Past Medical History  Diagnosis Date  . Migraine   . Depression   . Anxiety   . 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 Excessive daytime sleepiness 08/02/2014  . Depression 08/02/2014  . Bipolar 1 disorder, depressed (HCC)    History reviewed. No pertinent past surgical history. Family History  Problem Relation Age of Onset  . Alcohol abuse Paternal Aunt   . Depression Maternal Aunt   . Alzheimer's disease Maternal Grandmother   . Heart disease Maternal Grandfather    Social History  Substance Use Topics  . Smoking status: Never Smoker   . Smokeless tobacco: None  . Alcohol Use: 0.0 oz/week    0 Standard drinks or equivalent per week     Comment: only once a month   Interim medical history since last visit reviewed. Allergies and medications reviewed  Review of Systems Per HPI unless specifically indicated above     Objective:    BP 106/72 mmHg  Pulse 98  Temp(Src) 98 F (36.7 C) (Oral)  Resp 16  Wt 108 lb 11.2 oz (49.306 kg)  SpO2 97%  LMP 12/01/2015 (Approximate)  Wt Readings from Last 3 Encounters:  12/08/15  108 lb 11.2 oz (49.306 kg)  10/27/15 106 lb 6.4 oz (48.263 kg)  03/29/15 112 lb 4.8 oz (50.939 kg)   body mass index is 20.87 kg/(m^2).  Physical Exam  Constitutional: She appears well-developed and well-nourished. No distress.  Small framed female; no distress; wearing mask over face (due to vomiting), but does not appear ill or cachectic or toxic  HENT:  Head: Normocephalic and atraumatic.  Eyes: EOM are normal. No scleral icterus.  Neck: No JVD present. No thyromegaly present.  Cardiovascular: Normal rate, regular rhythm and normal heart sounds.   No murmur heard. Pulmonary/Chest: Effort normal and breath sounds normal. No respiratory distress. She has no wheezes.  Abdominal: Soft. Bowel sounds are normal. She exhibits no distension and no mass.  There is no tenderness. There is no guarding.  Musculoskeletal: Normal range of motion. She exhibits no edema.  Lymphadenopathy:    She has no cervical adenopathy.       Right cervical: No superficial cervical, no deep cervical and no posterior cervical adenopathy present.      Left cervical: No superficial cervical, no deep cervical and no posterior cervical adenopathy present.       Right: No supraclavicular adenopathy present.       Left: No supraclavicular adenopathy present.  Neurological: She is alert. She exhibits normal muscle tone.  Skin: Skin is warm and dry. No bruising, no ecchymosis, no petechiae and no rash noted. She is not diaphoretic. No cyanosis. No pallor.  Psychiatric: She has a normal mood and affect. Her behavior is normal. Judgment and thought content normal. Her mood appears not anxious. She does not exhibit a depressed mood.  Good eye contact with examiner      Assessment & Plan:   Problem List Items Addressed This Visit      Other   Fatigue    Better but still fatigued; ordering labs      Relevant Orders   Epstein-Barr virus VCA antibody panel (Completed)   CMV IgM (Completed)   Cytomegalovirus antibody, IgG (Completed)   Screening for HIV (human immunodeficiency virus)    Discussed one-time HIV screening recommendation per USPSTF guidelines; patient agrees with testing; HIV antibody ordered      Relevant Orders   HIV antibody (Completed)    Other Visit Diagnoses    Chronic night sweats    -  Primary    may be SSRI related; will check labs and continue work-up if no obvious source of if no improvement once meds out of system    Relevant Orders    CBC with Differential/Platelet (Completed)    Epstein-Barr virus VCA antibody panel (Completed)    Lactate Dehydrogenase (LDH) (Completed)    TSH (Completed)    COMPLETE METABOLIC PANEL WITH GFR (Completed)    Pharyngitis        Relevant Orders    CBC with Differential/Platelet (Completed)     Epstein-Barr virus VCA antibody panel (Completed)    CMV IgM (Completed)    Cytomegalovirus antibody, IgG (Completed)    Cramp of both lower extremities        check K+ and Mg2+, suggested tonic water and magnesium    Relevant Orders    Magnesium (Completed)    Gastroenteritis        suspect viral etiology for her vomiting; should be short-lived; will use zofran since already fatigued and tired       Follow up plan: Return in about 3 weeks (around 12/29/2015) for follow-up.  An  after-visit summary was printed and given to the patient at check-out.  Please see the patient instructions which may contain other information and recommendations beyond what is mentioned above in the assessment and plan.  Meds ordered this encounter  Medications  . ARIPiprazole (ABILIFY) 5 MG tablet    Sig: Take 5 mg by mouth daily.  . ondansetron (ZOFRAN ODT) 4 MG disintegrating tablet    Sig: Take 1 tablet (4 mg total) by mouth every 8 (eight) hours as needed for nausea or vomiting.    Dispense:  20 tablet    Refill:  0   Orders Placed This Encounter  Procedures  . CBC with Differential/Platelet  . Epstein-Barr virus VCA antibody panel  . HIV antibody  . Lactate Dehydrogenase (LDH)  . TSH  . COMPLETE METABOLIC PANEL WITH GFR  . CMV IgM  . Cytomegalovirus antibody, IgG  . Magnesium

## 2015-12-08 NOTE — Patient Instructions (Signed)
Do use the new medicine if needed for nausea Rest and hydration Bland food, advance slowly Let's get labs today Keep me posted of any new symptoms If you have not heard anything from my staff in a week about any orders/referrals/studies from today, please contact us here to follow-up [phone]

## 2015-12-09 LAB — HIV ANTIBODY (ROUTINE TESTING W REFLEX): HIV 1&2 Ab, 4th Generation: NONREACTIVE

## 2015-12-10 NOTE — Assessment & Plan Note (Signed)
Discussed one-time HIV screening recommendation per USPSTF guidelines; patient agrees with testing; HIV antibody ordered 

## 2015-12-11 LAB — EPSTEIN-BARR VIRUS VCA ANTIBODY PANEL
EBV NA IgG: <18 U/mL (ref <18.0)
EBV VCA IgG: <18 U/mL (ref <18.0)

## 2015-12-12 LAB — CMV IGM

## 2015-12-12 LAB — CYTOMEGALOVIRUS ANTIBODY, IGG

## 2015-12-27 ENCOUNTER — Emergency Department: Payer: Worker's Compensation

## 2015-12-27 ENCOUNTER — Emergency Department
Admission: EM | Admit: 2015-12-27 | Discharge: 2015-12-27 | Disposition: A | Discharge status: Home / Self Care | Payer: Worker's Compensation | Attending: Emergency Medicine | Admitting: Emergency Medicine

## 2015-12-27 DIAGNOSIS — Z79899 Other long term (current) drug therapy: Secondary | ICD-10-CM | POA: Diagnosis not present

## 2015-12-27 DIAGNOSIS — Y999 Unspecified external cause status: Secondary | ICD-10-CM | POA: Diagnosis not present

## 2015-12-27 DIAGNOSIS — M545 Low back pain, unspecified: Secondary | ICD-10-CM

## 2015-12-27 DIAGNOSIS — Y9389 Activity, other specified: Secondary | ICD-10-CM | POA: Insufficient documentation

## 2015-12-27 DIAGNOSIS — S060X0A Concussion without loss of consciousness, initial encounter: Secondary | ICD-10-CM | POA: Diagnosis not present

## 2015-12-27 DIAGNOSIS — Y92830 Public park as the place of occurrence of the external cause: Secondary | ICD-10-CM | POA: Diagnosis not present

## 2015-12-27 DIAGNOSIS — S0990XA Unspecified injury of head, initial encounter: Secondary | ICD-10-CM | POA: Diagnosis present

## 2015-12-27 DIAGNOSIS — F3131 Bipolar disorder, current episode depressed, mild: Secondary | ICD-10-CM | POA: Insufficient documentation

## 2015-12-27 DIAGNOSIS — W1800XA Striking against unspecified object with subsequent fall, initial encounter: Secondary | ICD-10-CM | POA: Insufficient documentation

## 2015-12-27 LAB — POCT PREGNANCY, URINE: Preg Test, Ur: NEGATIVE

## 2015-12-27 MED ORDER — ONDANSETRON 4 MG PO TBDP
4.0000 mg | ORAL_TABLET | Freq: Once | ORAL | Status: AC
  Administered 2015-12-27: 4 mg via ORAL
  Filled 2015-12-27: qty 1

## 2015-12-27 MED ORDER — ONDANSETRON 4 MG PO TBDP: 4.0000 mg | ORAL_TABLET | Freq: Three times a day (TID) | ORAL | Status: DC | PRN

## 2015-12-27 NOTE — ED Notes (Signed)
See triage   States she fell from bench hit head and now having back pain ambulates well treatment room

## 2015-12-27 NOTE — ED Provider Notes (Signed)
[facility] Emergency Department Provider Note  ____________________________________________  Time seen: Approximately 10:43 AM  I have reviewed the triage vital signs and the nursing notes.   HISTORY  Chief Complaint Fall   HPI Elizabeth Velez is a 20 y.o. female who reports today after a backwards fall off of a park bench at work. Patient states that the bench was unstable and toppled over backwards. She subsequently struck her head and tailbone on the ground. She is currently experiencing a 5/10 pain. She claims to have nausea, posterior headache, mild photophobia and phonophobia. She denies vomiting, change in vision, or tinnitus. Patient has had multiple concussions in the past and feels as though these symptoms are similar to what she experienced with concussions in the past.  Patient denies any LOC with this injury.   Past Medical History  Diagnosis Date  . Migraine   . Depression   . Anxiety   . 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 Excessive daytime sleepiness 08/02/2014  . Depression 08/02/2014  . Bipolar 1 disorder, depressed (HCC)     Patient Active Problem List   Diagnosis Date Noted  . Screening for HIV (human immunodeficiency virus) 12/08/2015  . Encounter for cholesteral screening for cardiovascular disease 03/29/2015  . Heart murmur previously undiagnosed 03/29/2015  . Oral contraceptive pill surveillance 03/29/2015  . 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 Fatigue 08/02/2014  . Major depressive disorder, recurrent, in remission (HCC) 08/02/2014    History reviewed. No pertinent past surgical history.  Current Outpatient Rx  Name  Route  Sig  Dispense  Refill  . ARIPiprazole (ABILIFY) 5 MG tablet   Oral   Take 5 mg by mouth daily.         Marland Kitchen desogestrel-ethinyl estradiol (APRI,EMOQUETTE,SOLIA) 0.15-30 MG-MCG tablet   Oral   Take 1 tablet by mouth daily.   3 Package   3   .  lamoTRIgine (LAMICTAL) 100 MG tablet   Oral   Take 150 mg by mouth daily.       3   . ondansetron (ZOFRAN ODT) 4 MG disintegrating tablet   Oral   Take 1 tablet (4 mg total) by mouth every 8 (eight) hours as needed for nausea or vomiting.   20 tablet   0     Allergies Penicillins and Zithromax  Family History  Problem Relation Age of Onset  . Alcohol abuse Paternal Aunt   . Depression Maternal Aunt   . Alzheimer's disease Maternal Grandmother   . Heart disease Maternal Grandfather     Social History Social History  Substance Use Topics  . Smoking status: Never Smoker   . Smokeless tobacco: None  . Alcohol Use: 0.0 oz/week    0 Standard drinks or equivalent per week     Comment: only once a month    Review of Systems Constitutional: No fever/chills ENT: No sore throat.Positive blurred vision Cardiovascular: Denies chest pain. Respiratory: Denies shortness of breath. Gastrointestinal: No abdominal pain.  Positive nausea, no vomiting.   Musculoskeletal: Positive for low back pain. Skin: Negative for rash. Neurological: Positive for headaches, no focal weakness or numbness.  10-point ROS otherwise negative.  ____________________________________________   PHYSICAL EXAM:  VITAL SIGNS: ED Triage Vitals  Enc Vitals Group     BP 12/27/15 1033 111/69 mmHg     Pulse Rate 12/27/15 1033 100     Resp 12/27/15 1033 16     Temp 12/27/15 1033 98 F (  36.7 C)     Temp Source 12/27/15 1033 Oral     SpO2 12/27/15 1033 100 %     Weight 12/27/15 1033 110 lb (49.896 kg)     Height 12/27/15 1033 4\' 11"  (1.499 m)     Head Cir --      Peak Flow --      Pain Score 12/27/15 1034 5     Pain Loc --      Pain Edu? --      Excl. in GC? --     Constitutional: Alert and oriented. Well appearing and in no acute distress. Eyes: Conjunctivae are normal. PERRL. EOMI. Head: Atraumatic. Nose: No congestion/rhinnorhea. Neck: No stridor. No cervical tenderness on palpation  posteriorly. Hematological/Lymphatic/Immunilogical: No cervical lymphadenopathy. Cardiovascular: Normal rate, regular rhythm. Grossly normal heart sounds.  Good peripheral circulation. Respiratory: Normal respiratory effort.  No retractions. Lungs CTAB. Musculoskeletal: Moves upper and lower extremities without difficulty and normal gait was noted. On examination of the back there is no gross deformity however there is moderate tenderness on palpation of the lumbar spine and paravertebral muscles. No active spasms were seen. Normal gait was noted. Neurologic:  Normal speech and language. No gross focal neurologic deficits are appreciated. No gait instability. Reflexes are 2+ bilaterally. Cranial nerves II through XII grossly intact. Skin:  Skin is warm, dry and intact. No rash noted. No ecchymosis, abrasions or erythema were noted. Psychiatric: Mood and affect are normal. Speech and behavior are normal.  ____________________________________________   LABS (all labs ordered are listed, but only abnormal results are displayed)  Labs Reviewed  POC URINE PREG, ED  POCT PREGNANCY, URINE    RADIOLOGY  Ct head per radiologist negative Lumbar spine per radiologist Negative for fracture, normal alignment.  ____________________________________________   PROCEDURES  Procedure(s) performed: None  Procedures  Critical Care performed: No  ____________________________________________   INITIAL IMPRESSION / ASSESSMENT AND PLAN / ED COURSE  Pertinent labs & imaging results that were available during my care of the patient were reviewed by me and considered in my medical decision making (see chart for details).  Patient was discharged on a prescription of Zofran 4 mg ODT as needed for nausea. Patient will continue using Tylenol as needed for pain. She is aware that she will have more soreness tomorrow than she is experiencing at this time. He is to follow-up with her primary care doctor if  any continued problems. ____________________________________________   FINAL CLINICAL IMPRESSION(S) / ED DIAGNOSES  Final diagnoses:  Concussion, without loss of consciousness, initial encounter  Bilateral low back pain without sciatica      NEW MEDICATIONS STARTED DURING THIS VISIT:  Discharge Medication List as of 12/27/2015  1:15 PM       Note:  This document was prepared using Dragon voice recognition software and may include unintentional dictation errors.    Tommi RumpsRhonda L Melisssa Donner, PA-C 12/27/15 1532  Emily FilbertJonathan E Williams, MD 12/28/15 [phone]

## 2015-12-27 NOTE — ED Notes (Signed)
Pt states she fell back onto hard ground while sitting on a bench and hit the back of her head, denies LOC, ,states she is having lower back pain but that is from an old injury

## 2015-12-27 NOTE — Discharge Instructions (Signed)
Zofran as needed for nausea. Tylenol as needed for pain. Keep your appointment on Friday with your primary care doctor.

## 2015-12-29 ENCOUNTER — Encounter: Payer: Self-pay | Admitting: Family Medicine

## 2015-12-29 ENCOUNTER — Ambulatory Visit (INDEPENDENT_AMBULATORY_CARE_PROVIDER_SITE_OTHER): Payer: 59 | Admitting: Family Medicine

## 2015-12-29 VITALS — BP 114/68 | HR 95 | Temp 98.7°F | Resp 18 | Ht 61.0 in | Wt 114.1 lb

## 2015-12-29 DIAGNOSIS — S060X0D Concussion without loss of consciousness, subsequent encounter: Secondary | ICD-10-CM | POA: Diagnosis not present

## 2015-12-29 DIAGNOSIS — R21 Rash and other nonspecific skin eruption: Secondary | ICD-10-CM

## 2015-12-29 DIAGNOSIS — B999 Unspecified infectious disease: Secondary | ICD-10-CM

## 2015-12-29 DIAGNOSIS — F3340 Major depressive disorder, recurrent, in remission, unspecified: Secondary | ICD-10-CM

## 2015-12-29 DIAGNOSIS — I7300 Raynaud's syndrome without gangrene: Secondary | ICD-10-CM

## 2015-12-29 DIAGNOSIS — M2480 Other specific joint derangements of unspecified joint, not elsewhere classified: Secondary | ICD-10-CM | POA: Diagnosis not present

## 2015-12-29 DIAGNOSIS — R61 Generalized hyperhidrosis: Secondary | ICD-10-CM | POA: Diagnosis not present

## 2015-12-29 NOTE — Patient Instructions (Addendum)
We'll have you see the rheumatologist [facility] Do keep me posted about additional symptoms or changes We can get an ultrasound of the abdomen and pelvis if symptoms worsen or you would like me to get that in the next several weeks Please feel free to read about birth control pills and night sweats and think about switching methods Mirena might be an option for you

## 2015-12-29 NOTE — Progress Notes (Signed)
BP 114/68   Pulse 95   Temp 98.7 F (37.1 C)   Resp 18   Ht  (1.549 m)   Wt 114 lb 1 oz (51.7 kg)   LMP 12/01/2015 Comment: Negative preg test  SpO2 96%   BMI 21.55 kg/m   Recheck pulse 95-98  Subjective:    Patient ID: Elizabeth Velez, female    DOB: 16-Jul-1995, 20 y.o.   MRN: [id number]  HPI: Elizabeth Velez is a 20 y.o. female  Chief Complaint  Patient presents with  . Night Sweats    3 week follow up   Since last visit, she has been to the ER She sat down on a bench to wait for kids to come to play group; bench flipped over backwards; had a concussion; went to the ER on Wednesday Tailbone hit the ground, hit hard dirt ground; no LOC Back hurts more than her head; they did xray and CT scan Little fuzzy thinking at first, better now; not dizzy like she was Having headaches off and on, but she has been really congested; yesterday was really bad, not sure which one, front or back Was having nausea, but did not even fill the Rx She used to horse back ride, has had about five concussions in her lifetime Negative head CT, reviewed Lumbar xray spine  The night sweats are still going on; going on six months; she has been talking to her therapist; she gets sick so often, she gets sick pretty frequently now; sick with upper respiratory infections, bronchitis, stomach things; had bad diarrhea Therapist suggested that she get a note to go to a rheumatologist, there are a lot of weird things about her health that might all be connected; she has zero tolerance for spicy foods; she does not feel good; she has a lot of cracking everywhere, everything goes crunch in the morning; nothing out of the ordinary with muscle aches; does have random rashes on the legs; those go away and thought related to night sweats; she does recall possible tick bite; urinates a lot; does not have a lot of control, has urgency; little bit of blood in diarrhea; having abd cramps; cramps with her period  for a while, was put on birth control, but getting worse again; no fevers I asked if they thought her medicine was due to the medicine The lamictal is the new one, but the sweating started before the lamictal She says the psychiatrist thought the prozac was most likely to cause sweating; they stopped the prozac a few weeks ago; on the abilify; saw him last week; she had some wonky side effects from the abilify Energy level was really bad before the lamictal, better than before, but still low  No real family hx of frequent infection Second cousins have diabetes, no other autoimmune diseases Travel to [place] and [place], different areas of [place], [place] and [place]; no recent travel No similar symptoms of night sweats No weight loss, fluctuates She switched out the mattress cover; did not change anything; changed out home environment Sweats during the day with exertion and heat Regular periods with birth control No enlarged lymph nodes She was tested for Lyme disease at [facility] She has been on OCPs for cramps; has changed types based on pharmacies; [facility]; has not been on progesterone-only OCPs  Depression screen [facility] 2/9 12/08/2015 10/27/2015 03/29/2015  Decreased Interest 0 0 2  Down, Depressed, Hopeless 1 0 2  PHQ - 2  Score 1 0 4  Altered sleeping - - 3  Tired, decreased energy - - 3  Change in appetite - - 2  Feeling bad or failure about yourself  - - 1  Trouble concentrating - - 0  Moving slowly or fidgety/restless - - 0  Suicidal thoughts - - 1  PHQ-9 Score - - 14  Difficult doing work/chores - - Very difficult   Relevant past medical, surgical, family and social history reviewed Past Medical History:  Diagnosis Date  . Anxiety   . Bipolar 1 disorder, depressed (HCC)   . Depression   . Depression 08/02/2014  . Excessive daytime sleepiness 08/02/2014  . 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 Migraine    History reviewed. No pertinent surgical  history. Family History  Problem Relation Age of Onset  . Alcohol abuse Paternal Aunt   . Depression Maternal Aunt   . Alzheimer's disease Maternal Grandmother   . Heart disease Maternal Grandfather    Social History  Substance Use Topics  . Smoking status: Never Smoker  . Smokeless tobacco: Not on file  . Alcohol use 0.0 oz/week     Comment: only once a month   Interim medical history since last visit reviewed. Allergies and medications reviewed  Review of Systems Per HPI unless specifically indicated above     Objective:    BP 114/68   Pulse 95   Temp 98.7 F (37.1 C)   Resp 18   Ht 5\' 1"  (1.549 m)   Wt 114 lb 1 oz (51.7 kg)   LMP 12/01/2015 Comment: Negative preg test  SpO2 96%   BMI 21.55 kg/m   Wt Readings from Last 3 Encounters:  12/29/15 114 lb 1 oz (51.7 kg)  12/27/15 110 lb (49.9 kg)  12/08/15 108 lb 11.2 oz (49.3 kg)    Physical Exam  Constitutional: She appears well-developed and well-nourished. No distress.  Eyes: EOM are normal. No scleral icterus.  Neck: No thyromegaly present.  Cardiovascular: Normal rate.   Pulmonary/Chest: Effort normal.  Abdominal: She exhibits no distension.  Neurological: She displays no tremor.  No tics  Skin: No pallor.  Psychiatric: She has a normal mood and affect. Her behavior is normal. Judgment and thought content normal. Her mood appears not anxious. She does not exhibit a depressed mood.   Results for orders placed or performed during the hospital encounter of 12/27/15  Pregnancy, urine POC  Result Value Ref Range   Preg Test, Ur NEGATIVE NEGATIVE      Assessment & Plan:   Problem List Items Addressed This Visit      Musculoskeletal and Integument   Rash   Relevant Orders   Ambulatory referral to Rheumatology     Other   Unexplained night sweats    HIV testing negative, normal LDH; I thought this would be from her SSRI; patient wishes to see rheumatologist; she has recurrent infections too, but wants to  start with rheum for work-up; offered US of abd and pelvis to r/o lymphoma; patient politely declined, but she is encouraged to call for further testing if symptoms persist and/or specialist work-up does not reveal rheumatologic reason for her symptoms      Relevant Orders   Ambulatory referral to Rheumatology   Raynaud phenomenon    Patient wishes to see rheumatologist      Relevant Orders   Ambulatory referral to Rheumatology   Major depressive disorder, recurrent, in remission (HCC)    Under the care  of psychiatrist; recent med changes; patient reports doing well      Joint crepitus - Primary   Relevant Orders   Ambulatory referral to Rheumatology    Other Visit Diagnoses    Concussion, without loss of consciousness, subsequent encounter       warned patient about cumulative effects of concussions   Recurrent infections       patient wishes to see rheum rather than further testing or seeing ID      Follow up plan: No Follow-up on file.  An after-visit summary was printed and given to the patient at check-out.  Please see the patient instructions which may contain other information and recommendations beyond what is mentioned above in the assessment and plan.  No orders of the defined types were placed in this encounter.   Orders Placed This Encounter  Procedures  . Ambulatory referral to Rheumatology

## 2016-01-07 NOTE — Assessment & Plan Note (Signed)
Under the care of psychiatrist; recent med changes; patient reports doing well

## 2016-01-07 NOTE — Assessment & Plan Note (Addendum)
HIV testing negative, normal LDH; I thought this would be from her SSRI; patient wishes to see rheumatologist; she has recurrent infections too, but wants to start with rheum for work-up; offered [place] of abd and pelvis to r/o lymphoma; patient politely declined, but she is encouraged to call for further testing if symptoms persist and/or specialist work-up does not reveal rheumatologic reason for her symptoms

## 2016-01-07 NOTE — Assessment & Plan Note (Signed)
Patient wishes to see rheumatologist

## 2016-01-08 ENCOUNTER — Telehealth: Payer: Self-pay | Admitting: Family Medicine

## 2016-01-10 NOTE — Telephone Encounter (Signed)
RE faxed to [place] Dr. Daphene Calamity

## 2016-01-10 NOTE — Telephone Encounter (Signed)
LMOM to inform pt °

## 2016-02-12 DIAGNOSIS — R252 Cramp and spasm: Secondary | ICD-10-CM | POA: Insufficient documentation

## 2016-02-12 DIAGNOSIS — F32A Depression, unspecified: Secondary | ICD-10-CM | POA: Insufficient documentation

## 2016-02-12 DIAGNOSIS — F329 Major depressive disorder, single episode, unspecified: Secondary | ICD-10-CM | POA: Insufficient documentation

## 2016-04-03 ENCOUNTER — Encounter: Payer: Self-pay | Admitting: Family Medicine

## 2016-04-19 ENCOUNTER — Ambulatory Visit: Payer: 59 | Admitting: Family Medicine

## 2016-05-13 ENCOUNTER — Other Ambulatory Visit: Payer: Self-pay

## 2016-05-13 DIAGNOSIS — Z3041 Encounter for surveillance of contraceptive pills: Secondary | ICD-10-CM

## 2016-05-13 MED ORDER — DESOGESTREL-ETHINYL ESTRADIOL 0.15-30 MG-MCG PO TABS: 1.0000 | ORAL_TABLET | Freq: Every day | ORAL | 0 refills | Status: DC

## 2016-05-13 NOTE — Telephone Encounter (Signed)
No mention of migraine in problem list; last pap 2016; okay for Rx

## 2016-05-16 NOTE — Telephone Encounter (Signed)
Tried calling pt  No answer °

## 2016-07-15 DIAGNOSIS — R634 Abnormal weight loss: Secondary | ICD-10-CM | POA: Insufficient documentation

## 2016-07-15 DIAGNOSIS — R197 Diarrhea, unspecified: Secondary | ICD-10-CM | POA: Insufficient documentation

## 2016-09-06 DIAGNOSIS — R109 Unspecified abdominal pain: Secondary | ICD-10-CM

## 2016-09-06 DIAGNOSIS — G8929 Other chronic pain: Secondary | ICD-10-CM | POA: Insufficient documentation

## 2016-09-06 DIAGNOSIS — Z8719 Personal history of other diseases of the digestive system: Secondary | ICD-10-CM | POA: Insufficient documentation

## 2016-09-09 ENCOUNTER — Other Ambulatory Visit: Payer: Self-pay

## 2016-09-09 ENCOUNTER — Encounter: Payer: Self-pay | Admitting: Family Medicine

## 2016-09-09 DIAGNOSIS — Z111 Encounter for screening for respiratory tuberculosis: Secondary | ICD-10-CM

## 2016-09-10 ENCOUNTER — Encounter: Payer: Self-pay | Admitting: Family Medicine

## 2016-09-19 ENCOUNTER — Other Ambulatory Visit: Payer: Self-pay

## 2016-09-19 DIAGNOSIS — Z111 Encounter for screening for respiratory tuberculosis: Secondary | ICD-10-CM

## 2016-09-21 LAB — QUANTIFERON TB GOLD ASSAY (BLOOD)
Interferon Gamma Release Assay: NEGATIVE
QUANTIFERON TB AG MINUS NIL: 0.08 [IU]/mL
Quantiferon Nil Value: 0.04 IU/mL

## 2016-11-06 ENCOUNTER — Ambulatory Visit (INDEPENDENT_AMBULATORY_CARE_PROVIDER_SITE_OTHER): Payer: 59 | Admitting: Family Medicine

## 2016-11-06 ENCOUNTER — Encounter: Payer: Self-pay | Admitting: Family Medicine

## 2016-11-06 VITALS — BP 102/64 | HR 97 | Temp 98.6°F | Resp 16 | Ht 61.0 in | Wt 109.8 lb

## 2016-11-06 DIAGNOSIS — R112 Nausea with vomiting, unspecified: Secondary | ICD-10-CM

## 2016-11-06 DIAGNOSIS — R109 Unspecified abdominal pain: Secondary | ICD-10-CM | POA: Diagnosis not present

## 2016-11-06 MED ORDER — DICYCLOMINE HCL 20 MG PO TABS: 20.0000 mg | ORAL_TABLET | Freq: Four times a day (QID) | ORAL | 0 refills | Status: DC

## 2016-11-06 MED ORDER — ONDANSETRON 4 MG PO TBDP: 4.0000 mg | ORAL_TABLET | Freq: Three times a day (TID) | ORAL | 0 refills | Status: DC | PRN

## 2016-11-06 NOTE — Patient Instructions (Addendum)
Abdominal Pain, Adult Many things can cause belly (abdominal) pain. Most times, belly pain is not dangerous. Many cases of belly pain can be watched and treated at home. Sometimes belly pain is serious, though. Your doctor will try to find the cause of your belly pain. Follow these instructions at home:  Take over-the-counter and prescription medicines only as told by your doctor. Do not take medicines that help you poop (laxatives) unless told to by your doctor.  Drink enough fluid to keep your pee (urine) clear or pale yellow.  Watch your belly pain for any changes.  Keep all follow-up visits as told by your doctor. This is important. Contact a doctor if:  Your belly pain changes or gets worse.  You are not hungry, or you lose weight without trying.  You are having trouble pooping (constipated) or have watery poop (diarrhea) for more than 2-3 days.  You have pain when you pee or poop.  Your belly pain wakes you up at night.  Your pain gets worse with meals, after eating, or with certain foods.  You are throwing up and cannot keep anything down.  You have a fever. Get help right away if:  Your pain does not go away as soon as your doctor says it should.  You cannot stop throwing up.  Your pain is only in areas of your belly, such as the right side or the left lower part of the belly.  You have bloody or black poop, or poop that looks like tar.  You have very bad pain, cramping, or bloating in your belly.  You have signs of not having enough fluid or water in your body (dehydration), such as:  Dark pee, very little pee, or no pee.  Cracked lips.  Dry mouth.  Sunken eyes.  Sleepiness.  Weakness. This information is not intended to replace advice given to you by your health care provider. Make sure you discuss any questions you have with your health care provider. Document Released: 11/20/2007 Document Revised: 12/22/2015 Document Reviewed: 11/15/2015 Elsevier  Interactive Patient Education  2017 [employer]  Nausea and Vomiting, Adult Feeling sick to your stomach (nausea) means that your stomach is upset or you feel like you have to throw up (vomit). Feeling more and more sick to your stomach can lead to throwing up. Throwing up happens when food and liquid from your stomach are thrown up and out the mouth. Throwing up can make you feel weak and cause you to get dehydrated. Dehydration can make you tired and thirsty, make you have a dry mouth, and make it so you pee (urinate) less often. Older adults and people with other diseases or a weak defense system (immune system) are at higher risk for dehydration. If you feel sick to your stomach or if you throw up, it is important to follow instructions from your doctor about how to take care of yourself. Follow these instructions at home: Eating and drinking  Follow these instructions as told by your doctor:  Take an oral rehydration solution (ORS). This is a drink that is sold at pharmacies and stores.  Drink clear fluids in small amounts as you are able, such as:  Water.  Ice chips.  Diluted fruit juice.  Low-calorie sports drinks.  Eat bland, easy-to-digest foods in small amounts as you are able, such as:  Bananas.  Applesauce.  Rice.  Low-fat (lean) meats.  Toast.  Crackers.  Avoid fluids that have a lot of sugar or caffeine in  them.  Avoid alcohol.  Avoid spicy or fatty foods. General instructions   Drink enough fluid to keep your pee (urine) clear or pale yellow.  Wash your hands often. If you cannot use soap and water, use hand sanitizer.  Make sure that all people in your home wash their hands well and often.  Take over-the-counter and prescription medicines only as told by your doctor.  Rest at home while you get better.  Watch your condition for any changes.  Breathe slowly and deeply when you feel sick to your stomach.  Keep all follow-up visits as told by  your doctor. This is important. Contact a doctor if:  You have a fever.  You cannot keep fluids down.  Your symptoms get worse.  You have new symptoms.  You feel sick to your stomach for more than two days.  You feel light-headed or dizzy.  You have a headache.  You have muscle cramps. Get help right away if:  You have pain in your chest, neck, arm, or jaw.  You feel very weak or you pass out (faint).  You throw up again and again.  You see blood in your throw-up.  Your throw-up looks like black coffee grounds.  You have bloody or black poop (stools) or poop that look like tar.  You have a very bad headache, a stiff neck, or both.  You have a rash.  You have very bad pain, cramping, or bloating in your belly (abdomen).  You have trouble breathing.  You are breathing very quickly.  Your heart is beating very quickly.  Your skin feels cold and clammy.  You feel confused.  You have pain when you pee.  You have signs of dehydration, such as:  Dark pee, hardly any pee, or no pee.  Cracked lips.  Dry mouth.  Sunken eyes.  Sleepiness.  Weakness. These symptoms may be an emergency. Do not wait to see if the symptoms will go away. Get medical help right away. Call your local emergency services (911 in the [place] Do not drive yourself to the hospital. This information is not intended to replace advice given to you by your health care provider. Make sure you discuss any questions you have with your health care provider. Document Released: 11/20/2007 Document Revised: 12/22/2015 Document Reviewed: 02/07/2015 Elsevier Interactive Patient Education  2017 [employer].

## 2016-11-06 NOTE — Progress Notes (Addendum)
Name: Elizabeth Velez   MRN: [id number]    DOB: September 30, 1995   Date:11/06/2016       Progress Note  Subjective  Chief Complaint  Chief Complaint  Patient presents with  . Emesis    started this morning, every 5   minutes, chills, bodyache    HPI  Pt presents with c/o vomiting >5 times this morning, endorses continued intermittent nausea, has had diarrhea (but this is ongoing and is seeing GI - is planning Colonoscopy). Endorses mild bilateral upper abdominal pain that began while vomiting. No new foods, has been drinking plenty of fluids, no recent sick contacts. 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  Notes subjective intermittent fever/chills while vomiting only.  She is sexually active but notes she takes OCP's without missing doses and uses condoms regularly, she declines Urine Pregnancy test today.  Patient Active Problem List   Diagnosis Date Noted  . Joint crepitus 12/29/2015  . Rash 12/29/2015  . Raynaud phenomenon 12/29/2015  . Unexplained night sweats 12/29/2015  . Screening for HIV (human immunodeficiency virus) 12/08/2015  . Encounter for cholesteral screening for cardiovascular disease 03/29/2015  . Heart murmur previously undiagnosed 03/29/2015  . Oral contraceptive pill surveillance 03/29/2015  . 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 Fatigue 08/02/2014  . Major depressive disorder, recurrent, in remission (HCC) 08/02/2014    Social History  Substance Use Topics  . Smoking status: Never Smoker  . Smokeless tobacco: Never Used  . Alcohol use 0.0 oz/week     Comment: only once a month     Current Outpatient Prescriptions:  .  desogestrel-ethinyl estradiol (APRI,EMOQUETTE,SOLIA) 0.15-30 MG-MCG tablet, Take 1 tablet by mouth daily. Please schedule a physical exam; thank you, Disp: 3 Package, Rfl: 0 .  lamoTRIgine (LAMICTAL) 100 MG tablet, Take 150 mg by mouth daily. , Disp: , Rfl: 3 .  FLUoxetine (PROZAC) 20 MG tablet, Take  by mouth., Disp: , Rfl:   Allergies  Allergen Reactions  . Penicillins   . Zithromax [Azithromycin]    ROS  Constitutional: Endorses subjective fevers and chills; negative for weight change.  Respiratory: Negative for cough and shortness of breath.   Cardiovascular: Negative for chest pain or palpitations.  Gastrointestinal: Positive for abdominal pain, no bowel changes per HPI.  Musculoskeletal: Negative for gait problem or joint swelling.  Skin: Negative for rash.  Neurological: Negative for dizziness or headache.  No other specific complaints in a complete review of systems (except as listed in HPI above).   Objective  Vitals:   11/06/16 1050  BP: 102/64  Pulse: 97  Resp: 16  Temp: 98.6 F (37 C)  TempSrc: Oral  SpO2: 96%  Weight: 109 lb 12.8 oz (49.8 kg)  Height: 5\' 1"  (1.549 m)    Body mass index is 20.75 kg/m.  Nursing Note and Vital Signs reviewed.  Physical Exam  Constitutional: Patient appears well-developed and well-nourished.  No distress.  HEENT: head atraumatic, normocephalic Cardiovascular: Normal rate, regular rhythm, S1/S2 present.  No murmur or rub heard. No BLE edema. Pulmonary/Chest: Effort normal and breath sounds clear. No respiratory distress or retractions. Abdominal: Soft and mild tenderness to BUQ - negative Murphy's Sign, bowel sounds present x4 quadrants. No rebound tenderness. Psychiatric: Patient has a normal mood and affect. behavior is normal. Judgment and thought content normal.  Recent Results (from the past 2160 hour(s))  Quantiferon tb gold assay     Status: None   Collection Time: 09/19/16  2:04 PM  Result Value Ref Range   Interferon Gamma Release Assay NEGATIVE NEGATIVE    Comment: Negative test result. M. tuberculosis complex infection unlikely.   Quantiferon Nil Value 0.04 IU/mL   Mitogen-Nil >10.00 IU/mL   Quantiferon Tb Ag Minus Nil Value 0.08 IU/mL    Comment:   The Nil tube value is used to determine if the patient  has a preexisting immune response which could cause a false-positive reading on the test. In order for a test to be valid, the Nil tube must have a value of less than or equal to 8.0 IU/mL.   The mitogen control tube is used to assure the patient has a healthy immune status and also serves as a control for correct blood handling and incubation. It is used to detect false-negative readings. The mitogen tube must have a gamma interferon value of greater than or equal to 0.5 IU/mL higher than the value of the Nil tube.   The TB antigen tube is coated with the M. tuberculosis specific antigens. For a test to be considered positive, the TB antigen tube value minus the Nil tube value must be greater than or equal to 0.35 IU/mL.   For additional information, please refer to http://education.[facility].com/faq/QFT (This link is being provided for informational/educational purposes only.)      Assessment & Plan  1. Non-intractable vomiting with nausea, unspecified vomiting type - ondansetron (ZOFRAN-ODT) 4 MG disintegrating tablet; Take 1 tablet (4 mg total) by mouth every 8 (eight) hours as needed for nausea or vomiting.  Dispense: 20 tablet; Refill: 0 -Discussed BRAT diet/bland diet, use of medications, and red flags as below.  2. Abdominal cramping - dicyclomine (BENTYL) 20 MG tablet; Take 1 tablet (20 mg total) by mouth every 6 (six) hours. For stomach cramping  Dispense: 30 tablet; Refill: 0  -Work note to return once symptoms have resolved x24 hours, will return to work only if no vomiting/nausea x24 hours.  -Red flags and when to present for emergency care or RTC including fever >101.60F, chest pain, shortness of breath, new/worsening/un-resolving symptoms, vomiting that won't resolve with medications, abdominal pain unresolved with medications, reviewed with patient at time of visit. Follow up and care instructions discussed and provided in AVS. -Will call/RTC in 1-2 days if  not improving. -Reviewed Health Maintenance: Schedule CPE Annual Exam.  I have reviewed this encounter including the documentation in this note and/or discussed this patient with the Deboraha Sprang, FNP, NP-C. I am certifying that I agree with the content of this note as supervising physician.  Alba Cory, MD [facility] 11/07/2016, 12:25 PM

## 2016-11-22 HISTORY — PX: WISDOM TOOTH EXTRACTION: SHX21

## 2016-11-29 ENCOUNTER — Ambulatory Visit (INDEPENDENT_AMBULATORY_CARE_PROVIDER_SITE_OTHER): Payer: 59 | Admitting: Family Medicine

## 2016-11-29 ENCOUNTER — Encounter: Payer: Self-pay | Admitting: Family Medicine

## 2016-11-29 VITALS — BP 104/62 | HR 97 | Temp 98.0°F | Resp 14 | Ht 59.0 in | Wt 105.6 lb

## 2016-11-29 DIAGNOSIS — Z23 Encounter for immunization: Secondary | ICD-10-CM

## 2016-11-29 DIAGNOSIS — Z0000 Encounter for general adult medical examination without abnormal findings: Secondary | ICD-10-CM | POA: Diagnosis not present

## 2016-11-29 NOTE — Progress Notes (Signed)
Patient ID: Elizabeth Velez, female   DOB: 1996-01-20, 21 y.o.   MRN: [id number]   Subjective:   Elizabeth Velez is a 21 y.o. female here for a complete physical exam  Interim issues since last visit: All blood tests from rheumatologist came back normal Rheum referred her to GI, and they will do colonoscopy She is still having diarrhea and stomach pain; still having night sweats No temperature pad on mattress Stopping the SSRI did not change the sweats Tried going gluten free, did not change anything Cutting out dairy now  USPSTF grade A and B recommendations Depression:  Depression screen [facility] 2/9 11/29/2016 12/08/2015 10/27/2015 03/29/2015  Decreased Interest 0 0 0 2  Down, Depressed, Hopeless 0 1 0 2  PHQ - 2 Score 0 1 0 4  Altered sleeping - - - 3  Tired, decreased energy - - - 3  Change in appetite - - - 2  Feeling bad or failure about yourself  - - - 1  Trouble concentrating - - - 0  Moving slowly or fidgety/restless - - - 0  Suicidal thoughts - - - 1  PHQ-9 Score - - - 14  Difficult doing work/chores - - - Very difficult   Hypertension: BP Readings from Last 3 Encounters:  11/29/16 104/62  11/06/16 102/64  12/29/15 114/68   Obesity: discussed weight, 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, more active, more stress Wt Readings from Last 3 Encounters:  11/29/16 105 lb 9.6 oz (47.9 kg)  11/06/16 109 lb 12.8 oz (49.8 kg)  12/29/15 114 lb 1 oz (51.7 kg)   BMI Readings from Last 3 Encounters:  11/29/16 21.33 kg/m  11/06/16 20.75 kg/m  12/29/15 21.55 kg/m    Alcohol: no Tobacco use: no HIV, hep B, hep C: done STD testing and prevention (chl/gon/syphilis): politley declined Intimate partner violence: no abuse Breast cancer: no lumps, done through GYN BRCA gene screening: no breast or ovarian cancer Cervical cancer screening: through GYN Osteoporosis: n/a Fall prevention/vitamin D: not taking Lipids: discussed, last were fine Lab Results  Component  Value Date   CHOL 147 03/29/2015   Lab Results  Component Value Date   HDL 55 03/29/2015   Lab Results  Component Value Date   LDLCALC 80 03/29/2015   Lab Results  Component Value Date   TRIG 61 03/29/2015   Lab Results  Component Value Date   CHOLHDL 2.7 03/29/2015   No results found for: LDLDIRECT Glucose:  Glucose  Date Value Ref Range Status  03/29/2015 83 65 - 99 mg/dL Final  10/10/2013 91 65 - 99 mg/dL Final   Glucose, Bld  Date Value Ref Range Status  12/08/2015 89 65 - 99 mg/dL Final   Colorectal cancer: colonoscopy in August Lung cancer:  n/a AAA: n/a Aspirin: n/a Diet: eats almonds, fiber Exercise: active Skin cancer: no worrisome moles   Past Medical History:  Diagnosis Date  . Anxiety   . Bipolar 1 disorder, depressed ([place])   . Depression   . Depression 08/02/2014  . Excessive daytime sleepiness 08/02/2014  . 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 Migraine    Past Surgical History:  Procedure Laterality Date  . WISDOM TOOTH EXTRACTION Bilateral 11/22/2016   Family History  Problem Relation Age of Onset  . Alzheimer's disease Maternal Grandmother   . Heart disease Maternal Grandfather   . Alcohol abuse Paternal Aunt   . Depression Maternal Aunt    Social History  Substance Use Topics  .  Smoking status: Never Smoker  . Smokeless tobacco: Never Used  . Alcohol use 0.0 oz/week     Comment: only once a month   Review of Systems  Constitutional: Negative for unexpected weight change.  Eyes: Negative for visual disturbance.  Respiratory: Negative for wheezing.   Cardiovascular: Negative for chest pain.  Gastrointestinal: Positive for blood in stool (getting colonoscopy, seeing GI).  Endocrine: Negative for polydipsia and polyuria.  Genitourinary: Negative for hematuria.  Musculoskeletal: Negative for arthralgias and joint swelling.  Hematological: Does not bruise/bleed easily.  Psychiatric/Behavioral: Negative for  dysphoric mood.    Objective:   Vitals:   11/29/16 1408  BP: 104/62  Pulse: 97  Resp: 14  Temp: 98 F (36.7 C)  TempSrc: Oral  SpO2: 97%  Weight: 105 lb 9.6 oz (47.9 kg)  Height: '4\' 11"'$  (1.499 m)   Body mass index is 21.33 kg/m. Wt Readings from Last 3 Encounters:  11/29/16 105 lb 9.6 oz (47.9 kg)  11/06/16 109 lb 12.8 oz (49.8 kg)  12/29/15 114 lb 1 oz (51.7 kg)   Physical Exam  Constitutional: She appears well-developed and well-nourished.  HENT:  Head: Normocephalic and atraumatic.  Right Ear: Hearing, tympanic membrane, external ear and ear canal normal.  Left Ear: Hearing, tympanic membrane, external ear and ear canal normal.  Eyes: Conjunctivae and EOM are normal. Right eye exhibits no hordeolum. Left eye exhibits no hordeolum. No scleral icterus.  Neck: Carotid bruit is not present. No thyromegaly present.  Cardiovascular: Normal rate, regular rhythm, S1 normal, S2 normal and normal heart sounds.   No extrasystoles are present.  Pulmonary/Chest: Effort normal and breath sounds normal. No respiratory distress.  Abdominal: Soft. Normal appearance and bowel sounds are normal. She exhibits no distension, no abdominal bruit, no pulsatile midline mass and no mass. There is no hepatosplenomegaly. There is no tenderness. No hernia.  Musculoskeletal: Normal range of motion. She exhibits no edema.  Lymphadenopathy:       Head (right side): No submandibular adenopathy present.       Head (left side): No submandibular adenopathy present.    She has no cervical adenopathy.  Neurological: She is alert. She displays no tremor. No cranial nerve deficit. She exhibits normal muscle tone. Gait normal.  Reflex Scores:      Patellar reflexes are 2+ on the right side and 2+ on the left side. Skin: Skin is warm and dry. No bruising and no ecchymosis noted. No cyanosis. No pallor.  Psychiatric: Her speech is normal and behavior is normal. Thought content normal. Her mood appears not  anxious. She does not exhibit a depressed mood.   Assessment/Plan:   Problem List Items Addressed This Visit      Other   Preventative health care - Primary    USPSTF grade A and B recommendations reviewed with patient; age-appropriate recommendations, preventive care, screening tests, etc discussed and encouraged; healthy living encouraged; see AVS for patient education given to patient GYN care through GYN provider       Other Visit Diagnoses    Need for diphtheria-tetanus-pertussis (Tdap) vaccine       Relevant Orders   Tdap vaccine greater than or equal to 7yo IM (Completed)       No orders of the defined types were placed in this encounter.  Orders Placed This Encounter  Procedures  . Tdap vaccine greater than or equal to 7yo IM    Follow up plan: Return in about 1 year (around 11/29/2017) for complete physical.  An After Visit Summary was printed and given to the patient.

## 2016-11-29 NOTE — Patient Instructions (Addendum)
Check out [employer] and other websites for [employer] ideas and recipes Try meatless crumbles, Boca burgers, Sweet Earth seitan, [place] vegan sausage mix (all plant protein), Sweet Earth [place] breakfast burritos, etc. You can further limit intake of saturated fats by switching to dairy free products (soy creamer, coconut milk coffee creamer, almond milk, Tofutti brand cream cheese and sour cream, Follow Your Heart vegenaise (mayonnaise alternative) and Follow Your Heart cheese alternative (the Antoine Primas is my favorite), [employer] suggest 6,578 iu of vitamin D3 daily if not getting sun Large flake nutritional yeast is a great source of vitamin B12 and other vitamins for non-meat eaters  Health Maintenance, Female Adopting a healthy lifestyle and getting preventive care can go a long way to promote health and wellness. Talk with your health care provider about what schedule of regular examinations is right for you. This is a good chance for you to check in with your provider about disease prevention and staying healthy. In between checkups, there are plenty of things you can do on your own. Experts have done a lot of research about which lifestyle changes and preventive measures are most likely to keep you healthy. Ask your health care provider for more information. Weight and diet Eat a healthy diet  Be sure to include plenty of vegetables, fruits, low-fat dairy products, and lean protein.  Do not eat a lot of foods high in solid fats, added sugars, or salt.  Get regular exercise. This is one of the most important things you can do for your health. ? Most adults should exercise for at least 150 minutes each week. The exercise should increase your heart rate and make you sweat (moderate-intensity exercise). ? Most adults should also do strengthening exercises at least twice a week. This is in addition to the moderate-intensity exercise.  Maintain a healthy weight  Body mass  index (BMI) is a measurement that can be used to identify possible weight problems. It estimates body fat based on height and weight. Your health care provider can help determine your BMI and help you achieve or maintain a healthy weight.  For females 23 years of age and older: ? A BMI below 18.5 is considered underweight. ? A BMI of 18.5 to 24.9 is normal. ? A BMI of 25 to 29.9 is considered overweight. ? A BMI of 30 and above is considered obese.  Watch levels of cholesterol and blood lipids  You should start having your blood tested for lipids and cholesterol at 23 years of age, then have this test every 5 years.  You may need to have your cholesterol levels checked more often if: ? Your lipid or cholesterol levels are high. ? You are older than 23 years of age. ? You are at high risk for heart disease.  Cancer screening Lung Cancer  Lung cancer screening is recommended for adults 86-55 years old who are at high risk for lung cancer because of a history of smoking.  A yearly low-dose CT scan of the lungs is recommended for people who: ? Currently smoke. ? Have quit within the past 15 years. ? Have at least a 30-pack-year history of smoking. A pack year is smoking an average of one pack of cigarettes a day for 1 year.  Yearly screening should continue until it has been 15 years since you quit.  Yearly screening should stop if you develop a health problem that would prevent you from having lung cancer treatment.  Breast  Cancer  Practice breast self-awareness. This means understanding how your breasts normally appear and feel.  It also means doing regular breast self-exams. Let your health care provider know about any changes, no matter how small.  If you are in your 20s or 30s, you should have a clinical breast exam (CBE) by a health care provider every 1-3 years as part of a regular health exam.  If you are 57 or older, have a CBE every year. Also consider having a breast  X-ray (mammogram) every year.  If you have a family history of breast cancer, talk to your health care provider about genetic screening.  If you are at high risk for breast cancer, talk to your health care provider about having an MRI and a mammogram every year.  Breast cancer gene (BRCA) assessment is recommended for women who have family members with BRCA-related cancers. BRCA-related cancers include: ? Breast. ? Ovarian. ? Tubal. ? Peritoneal cancers.  Results of the assessment will determine the need for genetic counseling and BRCA1 and BRCA2 testing.  Cervical Cancer Your health care provider may recommend that you be screened regularly for cancer of the pelvic organs (ovaries, uterus, and vagina). This screening involves a pelvic examination, including checking for microscopic changes to the surface of your cervix (Pap test). You may be encouraged to have this screening done every 3 years, beginning at age 44.  For women ages 79-65, health care providers may recommend pelvic exams and Pap testing every 3 years, or they may recommend the Pap and pelvic exam, combined with testing for human papilloma virus (HPV), every 5 years. Some types of HPV increase your risk of cervical cancer. Testing for HPV may also be done on women of any age with unclear Pap test results.  Other health care providers may not recommend any screening for nonpregnant women who are considered low risk for pelvic cancer and who do not have symptoms. Ask your health care provider if a screening pelvic exam is right for you.  If you have had past treatment for cervical cancer or a condition that could lead to cancer, you need Pap tests and screening for cancer for at least 20 years after your treatment. If Pap tests have been discontinued, your risk factors (such as having a new sexual partner) need to be reassessed to determine if screening should resume. Some women have medical problems that increase the chance of  getting cervical cancer. In these cases, your health care provider may recommend more frequent screening and Pap tests.  Colorectal Cancer  This type of cancer can be detected and often prevented.  Routine colorectal cancer screening usually begins at 23 years of age and continues through 23 years of age.  Your health care provider may recommend screening at an earlier age if you have risk factors for colon cancer.  Your health care provider may also recommend using home test kits to check for hidden blood in the stool.  A small camera at the end of a tube can be used to examine your colon directly (sigmoidoscopy or colonoscopy). This is done to check for the earliest forms of colorectal cancer.  Routine screening usually begins at age 91.  Direct examination of the colon should be repeated every 5-10 years through 23 years of age. However, you may need to be screened more often if early forms of precancerous polyps or small growths are found.  Skin Cancer  Check your skin from head to toe regularly.  Tell your  health care provider about any new moles or changes in moles, especially if there is a change in a mole's shape or color.  Also tell your health care provider if you have a mole that is larger than the size of a pencil eraser.  Always use sunscreen. Apply sunscreen liberally and repeatedly throughout the day.  Protect yourself by wearing long sleeves, pants, a wide-brimmed hat, and sunglasses whenever you are outside.  Heart disease, diabetes, and high blood pressure  High blood pressure causes heart disease and increases the risk of stroke. High blood pressure is more likely to develop in: ? People who have blood pressure in the high end of the normal range (130-139/85-89 mm Hg). ? People who are overweight or obese. ? People who are African American.  If you are 35-12 years of age, have your blood pressure checked every 3-5 years. If you are 58 years of age or older,  have your blood pressure checked every year. You should have your blood pressure measured twice-once when you are at a hospital or clinic, and once when you are not at a hospital or clinic. Record the average of the two measurements. To check your blood pressure when you are not at a hospital or clinic, you can use: ? An automated blood pressure machine at a pharmacy. ? A home blood pressure monitor.  If you are between 51 years and 58 years old, ask your health care provider if you should take aspirin to prevent strokes.  Have regular diabetes screenings. This involves taking a blood sample to check your fasting blood sugar level. ? If you are at a normal weight and have a low risk for diabetes, have this test once every three years after 23 years of age. ? If you are overweight and have a high risk for diabetes, consider being tested at a younger age or more often. Preventing infection Hepatitis B  If you have a higher risk for hepatitis B, you should be screened for this virus. You are considered at high risk for hepatitis B if: ? You were born in a country where hepatitis B is common. Ask your health care provider which countries are considered high risk. ? Your parents were born in a high-risk country, and you have not been immunized against hepatitis B (hepatitis B vaccine). ? You have HIV or AIDS. ? You use needles to inject street drugs. ? You live with someone who has hepatitis B. ? You have had sex with someone who has hepatitis B. ? You get hemodialysis treatment. ? You take certain medicines for conditions, including cancer, organ transplantation, and autoimmune conditions.  Hepatitis C  Blood testing is recommended for: ? Everyone born from [birth year] through 1967. ? Anyone with known risk factors for hepatitis C.  Sexually transmitted infections (STIs)  You should be screened for sexually transmitted infections (STIs) including gonorrhea and chlamydia if: ? You are sexually  active and are younger than 23 years of age. ? You are older than 23 years of age and your health care provider tells you that you are at risk for this type of infection. ? Your sexual activity has changed since you were last screened and you are at an increased risk for chlamydia or gonorrhea. Ask your health care provider if you are at risk.  If you do not have HIV, but are at risk, it may be recommended that you take a prescription medicine daily to prevent HIV infection. This is called pre-exposure prophylaxis (  PrEP). You are considered at risk if: ? You are sexually active and do not regularly use condoms or know the HIV status of your partner(s). ? You take drugs by injection. ? You are sexually active with a partner who has HIV.  Talk with your health care provider about whether you are at high risk of being infected with HIV. If you choose to begin PrEP, you should first be tested for HIV. You should then be tested every 3 months for as long as you are taking PrEP. Pregnancy  If you are premenopausal and you may become pregnant, ask your health care provider about preconception counseling.  If you may become pregnant, take 400 to 800 micrograms (mcg) of folic acid every day.  If you want to prevent pregnancy, talk to your health care provider about birth control (contraception). Osteoporosis and menopause  Osteoporosis is a disease in which the bones lose minerals and strength with aging. This can result in serious bone fractures. Your risk for osteoporosis can be identified using a bone density scan.  If you are 55 years of age or older, or if you are at risk for osteoporosis and fractures, ask your health care provider if you should be screened.  Ask your health care provider whether you should take a calcium or vitamin D supplement to lower your risk for osteoporosis.  Menopause may have certain physical symptoms and risks.  Hormone replacement therapy may reduce some of these  symptoms and risks. Talk to your health care provider about whether hormone replacement therapy is right for you. Follow these instructions at home:  Schedule regular health, dental, and eye exams.  Stay current with your immunizations.  Do not use any tobacco products including cigarettes, chewing tobacco, or electronic cigarettes.  If you are pregnant, do not drink alcohol.  If you are breastfeeding, limit how much and how often you drink alcohol.  Limit alcohol intake to no more than 1 drink per day for nonpregnant women. One drink equals 12 ounces of beer, 5 ounces of wine, or 1 ounces of hard liquor.  Do not use street drugs.  Do not share needles.  Ask your health care provider for help if you need support or information about quitting drugs.  Tell your health care provider if you often feel depressed.  Tell your health care provider if you have ever been abused or do not feel safe at home. This information is not intended to replace advice given to you by your health care provider. Make sure you discuss any questions you have with your health care provider. Document Released: 12/17/2010 Document Revised: 11/09/2015 Document Reviewed: 03/07/2015 Elsevier Interactive Patient Education  [employer].

## 2016-12-02 DIAGNOSIS — Z0000 Encounter for general adult medical examination without abnormal findings: Secondary | ICD-10-CM | POA: Insufficient documentation

## 2016-12-02 NOTE — Assessment & Plan Note (Signed)
USPSTF grade A and B recommendations reviewed with patient; age-appropriate recommendations, preventive care, screening tests, etc discussed and encouraged; healthy living encouraged; see AVS for patient education given to patient GYN care through GYN provider

## 2017-01-23 ENCOUNTER — Telehealth: Payer: Self-pay | Admitting: Family Medicine

## 2017-01-23 NOTE — Telephone Encounter (Signed)
Received the forms and gave them to Dr. lada to look over.

## 2017-01-23 NOTE — Telephone Encounter (Signed)
Evening folder. I didn't want to forget to send this email because we have to do this to keep an update of paperwork. Once you send to Dr Sherie DonLada please forward this to her. Thanks

## 2017-01-23 NOTE — Telephone Encounter (Signed)
FYI: Health Examination Form dropped off by patient to be filled out. Put in a sleeve and put on [facility] desk for Dr Sherie DonLada to fill out.

## 2017-01-23 NOTE — Telephone Encounter (Signed)
I don't have anything on my desk. Did you put the forms in the morning folder ?, if so Asher MuirJamie had the morning folder. Or are you putting the forms in the  Evening folder?

## 2017-01-27 ENCOUNTER — Other Ambulatory Visit: Payer: Self-pay

## 2017-01-27 DIAGNOSIS — Z0184 Encounter for antibody response examination: Secondary | ICD-10-CM

## 2017-04-23 ENCOUNTER — Encounter: Payer: Self-pay | Admitting: Emergency Medicine

## 2017-04-23 ENCOUNTER — Emergency Department
Admission: EM | Admit: 2017-04-23 | Discharge: 2017-04-23 | Disposition: A | Discharge status: Home / Self Care | Payer: 59 | Attending: Emergency Medicine | Admitting: Emergency Medicine

## 2017-04-23 ENCOUNTER — Other Ambulatory Visit: Payer: Self-pay

## 2017-04-23 ENCOUNTER — Emergency Department: Payer: 59

## 2017-04-23 DIAGNOSIS — Z79899 Other long term (current) drug therapy: Secondary | ICD-10-CM | POA: Diagnosis not present

## 2017-04-23 DIAGNOSIS — R1032 Left lower quadrant pain: Secondary | ICD-10-CM | POA: Diagnosis present

## 2017-04-23 LAB — COMPREHENSIVE METABOLIC PANEL
ALT: 29 U/L (ref 14–54)
ANION GAP: 7 (ref 5–15)
AST: 30 U/L (ref 15–41)
Albumin: 4.5 g/dL (ref 3.5–5.0)
Alkaline Phosphatase: 44 U/L (ref 38–126)
BUN: 13 mg/dL (ref 6–20)
CHLORIDE: 104 mmol/L (ref 101–111)
CO2: 27 mmol/L (ref 22–32)
Calcium: 9.2 mg/dL (ref 8.9–10.3)
Creatinine, Ser: 0.63 mg/dL (ref 0.44–1.00)
Glucose, Bld: 95 mg/dL (ref 65–99)
POTASSIUM: 4.4 mmol/L (ref 3.5–5.1)
SODIUM: 138 mmol/L (ref 135–145)
Total Bilirubin: 1 mg/dL (ref 0.3–1.2)
Total Protein: 7.3 g/dL (ref 6.5–8.1)

## 2017-04-23 LAB — WET PREP, GENITAL
CLUE CELLS WET PREP: NONE SEEN
SPERM: NONE SEEN
Trich, Wet Prep: NONE SEEN
Yeast Wet Prep HPF POC: NONE SEEN

## 2017-04-23 LAB — CBC
HEMATOCRIT: 43.6 % (ref 35.0–47.0)
HEMOGLOBIN: 14.8 g/dL (ref 12.0–16.0)
MCH: 32.6 pg (ref 26.0–34.0)
MCHC: 33.9 g/dL (ref 32.0–36.0)
MCV: 96.2 fL (ref 80.0–100.0)
PLATELETS: 259 10*3/uL (ref 150–440)
RBC: 4.53 MIL/uL (ref 3.80–5.20)
RDW: 12.5 % (ref 11.5–14.5)
WBC: 4.5 10*3/uL (ref 3.6–11.0)

## 2017-04-23 LAB — URINALYSIS, COMPLETE (UACMP) WITH MICROSCOPIC
BACTERIA UA: NONE SEEN
BILIRUBIN URINE: NEGATIVE
Glucose, UA: NEGATIVE mg/dL
Hgb urine dipstick: NEGATIVE
KETONES UR: NEGATIVE mg/dL
LEUKOCYTES UA: NEGATIVE
Nitrite: NEGATIVE
PH: 7 (ref 5.0–8.0)
Protein, ur: NEGATIVE mg/dL
Specific Gravity, Urine: 1.018 (ref 1.005–1.030)

## 2017-04-23 LAB — CHLAMYDIA/NGC RT PCR (ARMC ONLY)
Chlamydia Tr: NOT DETECTED
N gonorrhoeae: NOT DETECTED

## 2017-04-23 LAB — LIPASE, BLOOD: LIPASE: 42 U/L (ref 11–51)

## 2017-04-23 LAB — POCT PREGNANCY, URINE: Preg Test, Ur: NEGATIVE

## 2017-04-23 NOTE — Discharge Instructions (Addendum)
You were evaluated for left sided abdominal intermittent pains, and ongoing intermittent loose stools and although no certain cause was found, your exam and evaluation are overall reassuring in the emergency department today.  Please do follow-up with your primary care doctor as well as your GI doctor.  We discussed a couple of things in terms of possibility of abdominal migraines, or gut bacteria imbalance.  You may try taking probiotic or other fermented foods which would help increase the types of "good bacteria".  Return to the emergency department immediately for any worsening or uncontrolled pain, black or bloody stools, fever, or any other symptoms concerning to you.

## 2017-04-23 NOTE — ED Notes (Signed)
NAD noted at time of D/C. Pt denies questions or concerns. Pt ambulatory to the lobby at this time.  

## 2017-04-23 NOTE — ED Provider Notes (Signed)
[facility] Emergency Department Provider Note ____________________________________________   I have reviewed the triage vital signs and the triage nursing note.  HISTORY  Chief Complaint Abdominal Pain and Diarrhea   Historian Patient  HPI Elizabeth Velez is a 21 y.o. female with some chronic abdominal pains, llq and loose non-bloody stools has been having some intermittent left lower quadrant cramps that at times feel more like stabbing, nothing makes worse better, called her GI doctor this morning to see if she could get in and they told her that she should go to an urgent care today.  [facility] and before she was seen she was told that she needed to go to the ER.  Patient has not had fever.  She has not had any black or bloody stools.  She has not had dizziness or passing out.  She states that she has been evaluated by GI in the past, and the did not I thoroughly find a certain cause for her pain, but pains are similar.  Denies vaginal discharge or vaginal bleeding.  States that she is never been evaluated for ovarian pathology.  Pain currently is mild.  At times is moderate to severe.   Past Medical History:  Diagnosis Date  . Anxiety   . Bipolar 1 disorder, depressed (HCC)   . Depression   . Depression 08/02/2014  . Excessive daytime sleepiness 08/02/2014  . 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 Migraine     Patient Active Problem List   Diagnosis Date Noted  . Preventative health care 12/02/2016  . Joint crepitus 12/29/2015  . Rash 12/29/2015  . Raynaud phenomenon 12/29/2015  . Unexplained night sweats 12/29/2015  . Screening for HIV (human immunodeficiency virus) 12/08/2015  . Encounter for cholesteral screening for cardiovascular disease 03/29/2015  . Heart murmur previously undiagnosed 03/29/2015  . Oral contraceptive pill surveillance 03/29/2015  . Hypersomnia with long sleep time,  idiopathic 08/02/2014  . Fatigue 08/02/2014  . Major depressive disorder, recurrent, in remission (HCC) 08/02/2014    Past Surgical History:  Procedure Laterality Date  . WISDOM TOOTH EXTRACTION Bilateral 11/22/2016    Prior to Admission medications   Medication Sig Start Date End Date Taking? Authorizing Provider  desogestrel-ethinyl estradiol (APRI,EMOQUETTE,SOLIA) 0.15-30 MG-MCG tablet Take 1 tablet by mouth daily. Please schedule a physical exam; thank you 05/13/16   Kerman PasseyLada, Melinda P, MD  FLUoxetine (PROZAC) 20 MG tablet Take 20 mg by mouth daily.     [provider]  lamoTRIgine (LAMICTAL) 100 MG tablet Take 300 mg by mouth daily.  10/06/15   [provider]    Allergies  Allergen Reactions  . Penicillins   . Zithromax [Azithromycin]     Family History  Problem Relation Age of Onset  . Alzheimer's disease Maternal Grandmother   . Heart disease Maternal Grandfather   . Alcohol abuse Paternal Aunt   . Depression Maternal Aunt     Social History Social History   Tobacco Use  . Smoking status: Never Smoker  . Smokeless tobacco: Never Used  Substance Use Topics  . Alcohol use: Yes    Alcohol/week: 0.0 oz    Comment: only once a month  . Drug use: No    Review of Systems  Constitutional: Negative for fever. Eyes: Negative for visual changes. ENT: Negative for sore throat. Cardiovascular: Negative for chest pain. Respiratory: Negative for shortness of breath. Gastrointestinal: Negative for vomiting. Genitourinary: Negative for dysuria. Musculoskeletal: Negative  for back pain. Skin: Negative for rash. Neurological: Negative for headache.  ____________________________________________   PHYSICAL EXAM:  VITAL SIGNS: ED Triage Vitals  Enc Vitals Group     BP 04/23/17 0916 108/73     Pulse Rate 04/23/17 0916 89     Resp 04/23/17 0916 16     Temp 04/23/17 0916 99 F (37.2 C)     Temp Source 04/23/17 0916 Oral     SpO2 04/23/17 0916 100 %      Weight 04/23/17 0915 103 lb (46.7 kg)     Height 04/23/17 0915 4\' 11"  (1.499 m)     Head Circumference --      Peak Flow --      Pain Score 04/23/17 0919 7     Pain Loc --      Pain Edu? --      Excl. in GC? --      Constitutional: Alert and oriented. Well appearing and in no distress. HEENT   Head: Normocephalic and atraumatic.      Eyes: Conjunctivae are normal. Pupils equal and round.       Ears:         Nose: No congestion/rhinnorhea.   Mouth/Throat: Mucous membranes are moist.   Neck: No stridor. Cardiovascular/Chest: Normal rate, regular rhythm.  No murmurs, rubs, or gallops. Respiratory: Normal respiratory effort without tachypnea nor retractions. Breath sounds are clear and equal bilaterally. No wheezes/rales/rhonchi. Gastrointestinal: Soft. No distention, no guarding, no rebound. Mild tenderness left lower quadrant  Genitourinary/rectal: Tiny area of folliculitis on the mons pubis.  No vaginal discharge or bleeding.  Nontender cervix some discomfort due to tight introitus on bimanual exam.  No adnexal mass. Musculoskeletal: Nontender with normal range of motion in all extremities. No joint effusions.  No lower extremity tenderness.  No edema. Neurologic:  Normal speech and language. No gross or focal neurologic deficits are appreciated. Skin:  Skin is warm, dry and intact. No rash noted. Psychiatric: Mood and affect are normal. Speech and behavior are normal. Patient exhibits appropriate insight and judgment.   ____________________________________________  LABS (pertinent positives/negatives) I, Governor Rooks, MD the attending physician have reviewed the labs noted below.  Labs Reviewed  WET PREP, GENITAL - Abnormal; Notable for the following components:      Result Value   WBC, Wet Prep HPF POC FEW (*)    All other components within normal limits  URINALYSIS, COMPLETE (UACMP) WITH MICROSCOPIC - Abnormal; Notable for the following components:   Color,  Urine YELLOW (*)    APPearance CLEAR (*)    Squamous Epithelial / LPF 0-5 (*)    All other components within normal limits  CHLAMYDIA/NGC RT PCR ([facility] ONLY)  LIPASE, BLOOD  COMPREHENSIVE METABOLIC PANEL  CBC  POC URINE PREG, ED  POCT PREGNANCY, URINE    ____________________________________________    EKG I, Governor Rooks, MD, the attending physician have personally viewed and interpreted all ECGs.  None ____________________________________________  RADIOLOGY All Xrays were viewed by me.  Imaging interpreted by Radiologist, and I, Governor Rooks, MD the attending physician have reviewed the radiologist interpretation noted below.  US pelvic/tv: IMPRESSION: Normal pelvic sonogram. No adnexal torsion. No abnormal ovarian or adnexal masses. __________________________________________  PROCEDURES  Procedure(s) performed: None  Critical Care performed: None  ____________________________________________  No current facility-administered medications on file prior to encounter.    Current Outpatient Medications on File Prior to Encounter  Medication Sig Dispense Refill  . desogestrel-ethinyl estradiol (APRI,EMOQUETTE,SOLIA) 0.15-30 MG-MCG tablet Take 1  tablet by mouth daily. Please schedule a physical exam; thank you 3 Package 0  . FLUoxetine (PROZAC) 20 MG tablet Take 20 mg by mouth daily.     Marland Kitchen. lamoTRIgine (LAMICTAL) 100 MG tablet Take 300 mg by mouth daily.   3    ____________________________________________  ED COURSE / ASSESSMENT AND PLAN  Pertinent labs & imaging results that were available during my care of the patient were reviewed by me and considered in my medical decision making (see chart for details).   Sounds like her symptoms are more so chronic and ongoing.  Her exam and evaluation today do not indicate emergency source of abdominal discomfort, with her exam and evaluation reassuring today in the emergency department.  We did obtain pelvic, no sign  of cervicitis.  We did obtain ultrasound, because she had not have that evaluated, and was reassuring.  I do think her symptoms are likely GI in nature.  I have reassured her to go ahead and follow-up with her GI doctor.   DIFFERENTIAL DIAGNOSIS: Differential diagnosis includes, but is not limited to, ovarian cyst, ovarian torsion, acute appendicitis, diverticulitis, urinary tract infection/pyelonephritis, endometriosis, bowel obstruction, colitis, renal colic, gastroenteritis, hernia, fibroids, endometriosis, pregnancy related pain including ectopic pregnancy, etc.   CONSULTATIONS:   None  Patient / Family / Caregiver informed of clinical course, medical decision-making process, and agree with plan.   I discussed return precautions, follow-up instructions, and discharge instructions with patient and/or family.  Discharge Instructions : You were evaluated for left sided abdominal intermittent pains, and ongoing intermittent loose stools and although no certain cause was found, your exam and evaluation are overall reassuring in the emergency department today.  Please do follow-up with your primary care doctor as well as your GI doctor.  We discussed a couple of things in terms of possibility of abdominal migraines, or gut bacteria imbalance.  You may try taking probiotic or other fermented foods which would help increase the types of "good bacteria".  Return to the emergency department immediately for any worsening or uncontrolled pain, black or bloody stools, fever, or any other symptoms concerning to you.  ___________________________________________   FINAL CLINICAL IMPRESSION(S) / ED DIAGNOSES   Final diagnoses:  LLQ abdominal pain              Note: This dictation was prepared with Dragon dictation. Any transcriptional errors that result from this process are unintentional    Governor RooksLord, Jefferson Fullam, MD 04/23/17 1334

## 2017-04-23 NOTE — ED Triage Notes (Signed)
Pt here with c/o diarrhea and abdominal pain for about a month now, states the diarrhea and abdominal pain have worsened this am. Denies vomiting, however, is nauseated. NAD.

## 2017-04-24 ENCOUNTER — Encounter: Payer: Self-pay | Admitting: Family Medicine

## 2017-04-24 ENCOUNTER — Ambulatory Visit: Payer: 59 | Admitting: Family Medicine

## 2017-04-24 VITALS — BP 108/62 | HR 92 | Temp 97.7°F | Resp 16 | Ht 59.0 in | Wt 102.3 lb

## 2017-04-24 DIAGNOSIS — K529 Noninfective gastroenteritis and colitis, unspecified: Secondary | ICD-10-CM

## 2017-04-24 DIAGNOSIS — R1032 Left lower quadrant pain: Secondary | ICD-10-CM | POA: Diagnosis not present

## 2017-04-24 MED ORDER — DICYCLOMINE HCL 20 MG PO TABS: 20.0000 mg | ORAL_TABLET | Freq: Three times a day (TID) | ORAL | 0 refills | Status: DC

## 2017-04-24 NOTE — Progress Notes (Signed)
Name: Elizabeth Velez   MRN: [id number]    DOB: May 26, 1996   Date:04/24/2017       Progress Note  Subjective  Chief Complaint  Chief Complaint  Patient presents with  . Follow-up    seen in ER 04/23/17   . Abdominal Pain    pain on and off for 1 month before ER visit    HPI  Pt presents for ER follow up.  She first called her GI physician and was told to go to [facility]; she presented to UC and they sent her to the ER for evaluation.  She was experiencing LLQ abdominal pain 7-8/10 sharp pain intermittently x90mo- much worse than her typical abdominal pain.  She is having less intense pain today but it is still present and is unrelieved with BM's.  She has fluctuating loose stools chronically, worse over the last month. No dark/tarry stools, no vomiting, no fevers or chills, no back/flank pain, dysuria, abnormal vaginal discharge or bleeding. Endorses intermittent nausea.   Labs Reviewed from 04/23/2017: - Negative Wet prep, pregnancy, gonorrhea/chlamydia, UA; CBC, CMP, and Lipase WNL; UKoreaPelvic/Transvaginal showed "Normal pelvic sonogram".   - Patient [place] her GI provider[place] a [place] to [place] more [place] She is awaiting their call back for this appointment, but [facility].  Per Dr. SOlene Cravenlast note on 09/06/2016 he recommends colonoscopy and dietary interventions.  She has been unable to have colonsocopy performed, and drinks caffeine (1 soda or 1 cup of coffee in the morning); she says she is not a good eater.   Patient Active Problem List   Diagnosis Date Noted  . Preventative health care 12/02/2016  . Joint crepitus 12/29/2015  . Rash 12/29/2015  . Raynaud phenomenon 12/29/2015  . Unexplained night sweats 12/29/2015  . Screening for HIV (human immunodeficiency virus) 12/08/2015  . Encounter for cholesteral screening for cardiovascular disease 03/29/2015  . Heart murmur previously  undiagnosed 03/29/2015  . Oral contraceptive pill surveillance 03/29/2015  . 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 Fatigue 08/02/2014  . Major depressive disorder, recurrent, in remission ([facility] 08/02/2014    Social History   Tobacco Use  . Smoking status: Never Smoker  . Smokeless tobacco: Never Used  Substance Use Topics  . Alcohol use: Yes    Alcohol/week: 0.0 oz    Comment: only once a month     Current Outpatient Medications:  .  etonogestrel (NEXPLANON) 68 MG IMPL implant, 1 each once by Subdermal route., Disp: , Rfl:  .  FLUoxetine (PROZAC) 20 MG tablet, Take 20 mg by mouth daily. , Disp: , Rfl:  .  lamoTRIgine (LAMICTAL) 100 MG tablet, Take 300 mg by mouth daily. , Disp: , Rfl: 3 .  desogestrel-ethinyl estradiol (APRI,EMOQUETTE,SOLIA) 0.15-30 MG-MCG tablet, Take 1 tablet by mouth daily. Please schedule a physical exam; thank you (Patient not taking: Reported on 04/24/2017), Disp: 3 Package, Rfl: 0  Allergies  Allergen Reactions  . Penicillins   . Zithromax [Azithromycin]     ROS  Constitutional: Negative for fever or weight change.  Respiratory: Negative for cough and shortness of breath.   Cardiovascular: Negative for chest pain or palpitations.  Gastrointestinal: Positive for abdominal pain; positive bowel changes - more watery and more sudden in nature.  Musculoskeletal: Negative for gait problem or joint swelling.  Skin: Negative for rash.  Neurological: Negative for dizziness or headache.  No  other specific complaints in a complete review of systems (except as listed in HPI above).  Objective  Vitals:   04/24/17 1328  BP: 108/62  Pulse: 92  Resp: 16  Temp: 97.7 F (36.5 C)  TempSrc: Oral  SpO2: 98%  Weight: 102 lb 4.8 oz (46.4 kg)  Height: _0  (1.499 m)   Body mass index is 20.66 kg/m.  Nursing Note and Vital Signs reviewed.  Physical Exam  Constitutional: Patient appears well-developed and well-nourished. Obese. No  distress.  HEENT: head atraumatic, normocephalic Cardiovascular: Normal rate, regular rhythm, S1/S2 present.  No murmur or rub heard. No BLE edema. Pulmonary/Chest: Effort normal and breath sounds clear. No respiratory distress or retractions. Abdominal: Soft and non-tender, bowel sounds present x4 quadrants.  No CVA Tenderness Psychiatric: Patient has a normal mood and affect. behavior is normal. Judgment and thought content normal.  Recent Results (from the past 2160 hour(s))  Lipase, blood     Status: None   Collection Time: 04/23/17  9:16 AM  Result Value Ref Range   Lipase 42 11 - 51 U/L  Comprehensive metabolic panel     Status: None   Collection Time: 04/23/17  9:16 AM  Result Value Ref Range   Sodium 138 135 - 145 mmol/L   Potassium 4.4 3.5 - 5.1 mmol/L   Chloride 104 101 - 111 mmol/L   CO2 27 22 - 32 mmol/L   Glucose, Bld 95 65 - 99 mg/dL   BUN 13 6 - 20 mg/dL   Creatinine, Ser 0.63 0.44 - 1.00 mg/dL   Calcium 9.2 8.9 - 10.3 mg/dL   Total Protein 7.3 6.5 - 8.1 g/dL   Albumin 4.5 3.5 - 5.0 g/dL   AST 30 15 - 41 U/L   ALT 29 14 - 54 U/L   Alkaline Phosphatase 44 38 - 126 U/L   Total Bilirubin 1.0 0.3 - 1.2 mg/dL   GFR calc non Af Amer >60 >60 mL/min   GFR calc Af Amer >60 >60 mL/min    Comment: (NOTE) The eGFR has been calculated using the CKD EPI equation. This calculation has not been validated in all clinical situations. eGFR's persistently <60 mL/min signify possible Chronic Kidney Disease.    Anion gap 7 5 - 15  CBC     Status: None   Collection Time: 04/23/17  9:16 AM  Result Value Ref Range   WBC 4.5 3.6 - 11.0 K/uL   RBC 4.53 3.80 - 5.20 MIL/uL   Hemoglobin 14.8 12.0 - 16.0 g/dL   HCT 43.6 35.0 - 47.0 %   MCV 96.2 80.0 - 100.0 fL   MCH 32.6 26.0 - 34.0 pg   MCHC 33.9 32.0 - 36.0 g/dL   RDW 12.5 11.5 - 14.5 %   Platelets 259 150 - 440 K/uL  Urinalysis, Complete w Microscopic     Status: Abnormal   Collection Time: 04/23/17  9:16 AM  Result Value Ref  Range   Color, Urine YELLOW (A) YELLOW   APPearance CLEAR (A) CLEAR   Specific Gravity, Urine 1.018 1.005 - 1.030   pH 7.0 5.0 - 8.0   Glucose, UA NEGATIVE NEGATIVE mg/dL   Hgb urine dipstick NEGATIVE NEGATIVE   Bilirubin Urine NEGATIVE NEGATIVE   Ketones, ur NEGATIVE NEGATIVE mg/dL   Protein, ur NEGATIVE NEGATIVE mg/dL   Nitrite NEGATIVE NEGATIVE   Leukocytes, UA NEGATIVE NEGATIVE   RBC / HPF 0-5 0 - 5 RBC/hpf   WBC, UA 0-5 0 - 5 WBC/hpf  Bacteria, UA NONE SEEN NONE SEEN   Squamous Epithelial / LPF 0-5 (A) NONE SEEN   Mucus PRESENT   Pregnancy, urine POC     Status: None   Collection Time: 04/23/17  9:34 AM  Result Value Ref Range   Preg Test, Ur NEGATIVE NEGATIVE    Comment:        THE SENSITIVITY OF THIS METHODOLOGY IS >24 mIU/mL   Chlamydia/NGC rt PCR     Status: None   Collection Time: 04/23/17 11:41 AM  Result Value Ref Range   Specimen source GC/Chlam ENDOCERVICAL    Chlamydia Tr NOT DETECTED NOT DETECTED   N gonorrhoeae NOT DETECTED NOT DETECTED    Comment: (NOTE) 100  This methodology has not been evaluated in pregnant women or in 200  patients with a history of hysterectomy. 300 400  This methodology will not be performed on patients less than 21  years of age.   Wet prep, genital     Status: Abnormal   Collection Time: 04/23/17 11:41 AM  Result Value Ref Range   Yeast Wet Prep HPF POC NONE SEEN NONE SEEN   Trich, Wet Prep NONE SEEN NONE SEEN   Clue Cells Wet Prep HPF POC NONE SEEN NONE SEEN   WBC, Wet Prep HPF POC FEW (A) NONE SEEN   Sperm NONE SEEN      Assessment & Plan  1. Left lower quadrant pain - dicyclomine (BENTYL) 20 MG tablet; Take 1 tablet (20 mg total) 4 (four) times daily -  before meals and at bedtime by mouth.  Dispense: 30 tablet; Refill: 0 - Amb ref to Medical Nutrition Therapy-MNT - Ambulatory referral to Gastroenterology - Pt requests new referral to [facility].  2. Chronic diarrhea - Ambulatory referral to Gastroenterology  -  Work note provided for today. -Red flags and when to present for emergency care or RTC including fever >101.25F, chest pain, shortness of breath, severe abdominal distension,  Blood in stool/black and tarry stool, vomiting, new/worsening/un-resolving symptoms, reviewed with patient at time of visit. Follow up and care instructions discussed and provided in AVS.

## 2017-04-24 NOTE — Patient Instructions (Addendum)
Abdominal Pain, Adult Many things can cause belly (abdominal) pain. Most times, belly pain is not dangerous. Many cases of belly pain can be watched and treated at home. Sometimes belly pain is serious, though. Your doctor will try to find the cause of your belly pain. Follow these instructions at home:  Take over-the-counter and prescription medicines only as told by your doctor. Do not take medicines that help you poop (laxatives) unless told to by your doctor.  Drink enough fluid to keep your pee (urine) clear or pale yellow.  Watch your belly pain for any changes.  Keep all follow-up visits as told by your doctor. This is important. Contact a doctor if:  Your belly pain changes or gets worse.  You are not hungry, or you lose weight without trying.  You are having trouble pooping (constipated) or have watery poop (diarrhea) for more than 2-3 days.  You have pain when you pee or poop.  Your belly pain wakes you up at night.  Your pain gets worse with meals, after eating, or with certain foods.  You are throwing up and cannot keep anything down.  You have a fever. Get help right away if:  Your pain does not go away as soon as your doctor says it should.  You cannot stop throwing up.  Your pain is only in areas of your belly, such as the right side or the left lower part of the belly.  You have bloody or black poop, or poop that looks like tar.  You have very bad pain, cramping, or bloating in your belly.  You have signs of not having enough fluid or water in your body (dehydration), such as: ? Dark pee, very little pee, or no pee. ? Cracked lips. ? Dry mouth. ? Sunken eyes. ? Sleepiness. ? Weakness. This information is not intended to replace advice given to you by your health care provider. Make sure you discuss any questions you have with your health care provider. Document Released: 11/20/2007 Document Revised: 12/22/2015 Document Reviewed: 11/15/2015 Elsevier  Interactive Patient Education  2017 [employer]  Food Choices to Help Relieve Diarrhea, Adult When you have diarrhea, the foods you eat and your eating habits are very important. Choosing the right foods and drinks can help:  Relieve diarrhea.  Replace lost fluids and nutrients.  Prevent dehydration.  What general guidelines should I follow? Relieving diarrhea  Choose foods with less than 2 g or .07 oz. of fiber per serving.  Limit fats to less than 8 tsp (38 g or 1.34 oz.) a day.  Avoid the following: ? Foods and beverages sweetened with high-fructose corn syrup, honey, or sugar alcohols such as xylitol, sorbitol, and mannitol. ? Foods that contain a lot of fat or sugar. ? Fried, greasy, or spicy foods. ? High-fiber grains, breads, and cereals. ? Raw fruits and vegetables.  Eat foods that are rich in probiotics. These foods include dairy products such as yogurt and fermented milk products. They help increase healthy bacteria in the stomach and intestines (gastrointestinal tract, or GI tract).  If you have lactose intolerance, avoid dairy products. These may make your diarrhea worse.  Take medicine to help stop diarrhea (antidiarrheal medicine) only as told by your health care provider. Replacing nutrients  Eat small meals or snacks every 3-4 hours.  Eat bland foods, such as white rice, toast, or baked potato, until your diarrhea starts to get better. Gradually reintroduce nutrient-rich foods as tolerated or as told by your health  care provider. This includes: ? Well-cooked protein foods. ? Peeled, seeded, and soft-cooked fruits and vegetables. ? Low-fat dairy products.  Take vitamin and mineral supplements as told by your health care provider. Preventing dehydration   Start by sipping water or a special solution to prevent dehydration (oral rehydration solution, ORS). Urine that is clear or pale yellow means that you are getting enough fluid.  Try to drink at least  8-10 cups of fluid each day to help replace lost fluids.  You may add other liquids in addition to water, such as clear juice or decaffeinated sports drinks, as tolerated or as told by your health care provider.  Avoid drinks with caffeine, such as coffee, tea, or soft drinks.  Avoid alcohol. What foods are recommended? The items listed may not be a complete list. Talk with your health care provider about what dietary choices are best for you. Grains White rice. White, [place], or pita breads (fresh or toasted), including plain rolls, buns, or bagels. White pasta. Saltine, soda, or graham crackers. Pretzels. Low-fiber cereal. Cooked cereals made with water (such as cornmeal, farina, or cream cereals). Plain muffins. Matzo. Melba toast. Zwieback. Vegetables Potatoes (without the skin). Most well-cooked and canned vegetables without skins or seeds. Tender lettuce. Fruits Apple sauce. Fruits canned in juice. Cooked apricots, cherries, grapefruit, peaches, pears, or plums. Fresh bananas and cantaloupe. Meats and other protein foods Baked or boiled chicken. Eggs. Tofu. Fish. Seafood. Smooth nut butters. Ground or well-cooked tender beef, ham, veal, lamb, pork, or poultry. Dairy Plain yogurt, kefir, and unsweetened liquid yogurt. Lactose-free milk, buttermilk, skim milk, or soy milk. Low-fat or nonfat hard cheese. Beverages Water. Low-calorie sports drinks. Fruit juices without pulp. Strained tomato and vegetable juices. Decaffeinated teas. Sugar-free beverages not sweetened with sugar alcohols. Oral rehydration solutions, if approved by your health care provider. Seasoning and other foods Bouillon, broth, or soups made from recommended foods. What foods are not recommended? The items listed may not be a complete list. Talk with your health care provider about what dietary choices are best for you. Grains Whole grain, whole wheat, bran, or rye breads, rolls, pastas, and crackers. Wild or brown  rice. Whole grain or bran cereals. Barley. Oats and oatmeal. Corn tortillas or taco shells. Granola. Popcorn. Vegetables Raw vegetables. Fried vegetables. Cabbage, broccoli, Brussels sprouts, artichokes, baked beans, beet greens, corn, kale, legumes, peas, sweet potatoes, and yams. Potato skins. Cooked spinach and cabbage. Fruits Dried fruit, including raisins and dates. Raw fruits. Stewed or dried prunes. Canned fruits with syrup. Meat and other protein foods Fried or fatty meats. Deli meats. Chunky nut butters. Nuts and seeds. Beans and lentils. Berniece Salines. Hot dogs. Sausage. Dairy High-fat cheeses. Whole milk, chocolate milk, and beverages made with milk, such as milk shakes. Half-and-half. Cream. sour cream. Ice cream. Beverages Caffeinated beverages (such as coffee, tea, soda, or energy drinks). Alcoholic beverages. Fruit juices with pulp. Prune juice. Soft drinks sweetened with high-fructose corn syrup or sugar alcohols. High-calorie sports drinks. Fats and oils Butter. Cream sauces. Margarine. Salad oils. Plain salad dressings. Olives. Avocados. Mayonnaise. Sweets and desserts Sweet rolls, doughnuts, and sweet breads. Sugar-free desserts sweetened with sugar alcohols such as xylitol and sorbitol. Seasoning and other foods Honey. Hot sauce. Chili powder. Gravy. Cream-based or milk-based soups. Pancakes and waffles. Summary  When you have diarrhea, the foods you eat and your eating habits are very important.  Make sure you get at least 8-10 cups of fluid each day, or enough to keep your urine  clear or pale yellow.  Eat bland foods and gradually reintroduce healthy, nutrient-rich foods as tolerated, or as told by your health care provider.  Avoid high-fiber, fried, greasy, or spicy foods. This information is not intended to replace advice given to you by your health care provider. Make sure you discuss any questions you have with your health care provider. Document Released: 08/24/2003  Document Revised: 05/31/2016 Document Reviewed: 05/31/2016 Elsevier Interactive Patient Education  2017 [employer]

## 2017-05-05 ENCOUNTER — Ambulatory Visit: Payer: 59 | Admitting: Gastroenterology

## 2017-05-05 ENCOUNTER — Encounter: Payer: Self-pay | Admitting: Gastroenterology

## 2017-05-05 ENCOUNTER — Ambulatory Visit: Payer: Self-pay | Admitting: Gastroenterology

## 2017-05-05 VITALS — BP 103/69 | HR 108 | Temp 98.4°F | Ht 59.0 in | Wt 104.2 lb

## 2017-05-05 DIAGNOSIS — K580 Irritable bowel syndrome with diarrhea: Secondary | ICD-10-CM

## 2017-05-06 NOTE — Progress Notes (Signed)
Melodie BouillonVarnita Christopher Hink, MD [address], [address], [place], [place], [address] [address], [address], [place], [place], [address] Phone: [phone]  Fax: [phone]  Consultation  Referring Provider:     Kerman PasseyLada, Melinda P, MD Primary Care Physician:  Kerman PasseyLada, Melinda P, MD Primary Gastroenterologist:  Pasty SpillersVarnita B Shivansh Hardaway, MD        Reason for Consultation:     diarrhea  Date of Consultation:  05/06/2017         HPI:   Elizabeth Velez is a 21 y.o. female with chronic hx of diarrhea here for initial visit. Reports going to the bathroom every time she eats. Only had one episode of bright red blood a couple months ago that spontaneously resolved. No drop in Hgb. Has hx of Major depressive order but does not think symptoms are related to stressors. No family hx of colon cancer, IBD. Weight in Feb 2016 was 110 lb and is 104 lb today which is not significant weight loss. No dysphagia, N/V. Mild RLQ cramping at times intermittently. Gyn exam has been normal and pelvic ultrasound was normal. Labs completely normal with no anemia.   Past Medical History:  Diagnosis Date  . Anxiety   . Bipolar 1 disorder, depressed (HCC)   . Depression   . Depression 08/02/2014  . Excessive daytime sleepiness 08/02/2014  . 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 Migraine     Past Surgical History:  Procedure Laterality Date  . WISDOM TOOTH EXTRACTION Bilateral 11/22/2016    Prior to Admission medications   Medication Sig Start Date End Date Taking? Authorizing Provider  dicyclomine (BENTYL) 20 MG tablet Take 1 tablet (20 mg total) 4 (four) times daily -  before meals and at bedtime by mouth. 04/24/17   Doren CustardBoyce, Emily E, FNP  etonogestrel (NEXPLANON) 68 MG IMPL implant 1 each once by Subdermal route.    [provider]  FLUoxetine (PROZAC) 20 MG tablet Take 20 mg by mouth daily.     [provider]  lamoTRIgine (LAMICTAL) 200 MG tablet Take by mouth.    [provider]  LORazepam (ATIVAN) 0.5 MG tablet Take by mouth.    [provider]  Melatonin 3 MG TABS Take by mouth.    [provider]    Family History  Problem Relation Age of Onset  . Alzheimer's disease Maternal Grandmother   . Heart disease Maternal Grandfather   . Alcohol abuse Paternal Aunt   . Depression Maternal Aunt      Social History   Tobacco Use  . Smoking status: Never Smoker  . Smokeless tobacco: Never Used  Substance Use Topics  . Alcohol use: No    Alcohol/week: 0.0 oz    Frequency: Never    Comment: only once a month  . Drug use: No    Allergies as of 05/05/2017 - Review Complete 05/05/2017  Allergen Reaction Noted  . Penicillins  08/02/2014  . Zithromax [azithromycin]  08/02/2014    Review of Systems:    All systems reviewed and negative except where noted in HPI.   Physical Exam:  Vital signs in last 24 hours: Vitals:   05/05/17 1527  BP: 103/69  Pulse: (!) 108  Temp: 98.4 F (36.9 C)  TempSrc: Oral  Weight: 47.3 kg (104 lb 3.2 oz)  Height: 4\' 11"  (1.499 m)     General:   Pleasant, cooperative in NAD Head:  Normocephalic and atraumatic. Eyes:   No icterus.  Conjunctiva pink. PERRLA. Ears:  Normal auditory acuity. Neck:  Supple; no masses or thyroidomegaly Lungs: Respirations even and unlabored. Lungs clear to auscultation bilaterally.   No wheezes, crackles, or rhonchi.  Heart:  Regular rate and rhythm;  Without murmur, clicks, rubs or gallops Abdomen:  Soft, nondistended, nontender. Normal bowel sounds. No appreciable masses or hepatomegaly.  No rebound or guarding.  Neurologic:  Alert and oriented x3;  grossly normal neurologically. Skin:  Intact without significant lesions or rashes. Cervical Nodes:  No significant cervical adenopathy. Psych:  Alert and cooperative. Normal affect.  LAB RESULTS: No results for input(s): WBC, HGB, HCT, PLT in the last 72 hours. BMET No results for input(s): NA, K, CL, CO2,  GLUCOSE, BUN, CREATININE, CALCIUM in the last 72 hours. LFT No results for input(s): PROT, ALBUMIN, AST, ALT, ALKPHOS, BILITOT, BILIDIR, IBILI in the last 72 hours. PT/INR No results for input(s): LABPROT, INR in the last 72 hours.  STUDIES: No results found.    Impression / Plan:   Elizabeth ScottRachel M Velez is a 21 y.o. y/o female with chronic diarrhea with symptoms most consistent with IBS-C  No alarm symptoms present Her stools are watery. Will start Metamucil to help bulk stool Pt. Reassured about diagnosis and is understanding of it If BRBPR reoccurs can consider colonoscopy. BRBPR likely due to hemorrhoids If diarrhea does not improve will change management accordingly Follow up in 6 months.   Thank you for involving me in the care of this patient.      Pasty SpillersVarnita B Donterrius Santucci, MD  05/06/2017, 6:02 PM

## 2017-05-29 ENCOUNTER — Encounter: Payer: Self-pay | Admitting: Dietician

## 2017-05-29 ENCOUNTER — Encounter: Payer: 59 | Attending: Family Medicine | Admitting: Dietician

## 2017-05-29 VITALS — Ht 59.0 in | Wt 103.1 lb

## 2017-05-29 DIAGNOSIS — G8929 Other chronic pain: Secondary | ICD-10-CM | POA: Insufficient documentation

## 2017-05-29 DIAGNOSIS — Z713 Dietary counseling and surveillance: Secondary | ICD-10-CM | POA: Diagnosis present

## 2017-05-29 DIAGNOSIS — R1032 Left lower quadrant pain: Secondary | ICD-10-CM | POA: Insufficient documentation

## 2017-05-29 DIAGNOSIS — K580 Irritable bowel syndrome with diarrhea: Secondary | ICD-10-CM

## 2017-05-29 NOTE — Patient Instructions (Signed)
   Choose low fiber foods in general, use IBS resource for options in different food groups.  Choose low fodmap foods, decreasing portions or frequency of gluten-containing grains as able.   Eat relatively small amounts 5-6 times a day, as able.   Include a source of protein when you eat, such as lowfat cheese, peanut butter (2 Tbsps), lean chicken, [place]key or fish, or eggs.   Include low fodmap fruits and vegetables as often as possible for good overall nutrition.   Try websites such as Scientist, forensickatescarlata.com for ideas for meals, snacks, and other fodmap advice. Try looking for low fodmap book if needed.   Try guar gum fiber supplement, available at [facility] or online

## 2017-05-29 NOTE — Progress Notes (Signed)
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 Auditory . Visual . Hands-on  Current weight: 103.1lbs  Height: 4'11" Medications, supplements: reconciled list in medical record  Progress and evaluation: Patient reports IBS pain for several months, and had continuous diarrhea for some time over the past summer; now somewhat resolved but continues to have pain. She has triet gluten-free and dairy-free in the past with no symptom relief, but states she did not follow gluten free strictly, as her diet consists of mostly starches. 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   Physical activity: sporadic walking, running, yoga  Dietary Intake:  Usual eating pattern includes 2-3 meals and 1-2 snacks per day. Dining out frequency: 4 meals per week.  Breakfast: granola bar Snack: none Lunch: 10:30am yogurt Snack: peanut butter crackers; when home chips or pretzels during the day, sometimes dry cereal Supper: cereal often; pasta with tomato sauce or cheese tortellini with pesto. Does not eat red meats.  Snack: occasionally cookies Beverages: water 1 bottle during school day; occasionally Dr. Reino KentPepper or coffee; tea at home.   Nutrition Care Education: Topics covered: IBS Basic nutrition: basic food groups, appropriate nutrient balance, appropriate meal and snack schedule, general nutrition guidelines    IBS: low fodmap diet and strategies for implementing diet; importance of allowing healing of GI tract after long-term diarrhea through limited fiber, fat, caffeine; and including probiotic and prebiotic foods and supplements; discussed option of guar gum fiber as recommended by  IBS RD Hassan BucklerKate Scarlata; importance of eating small amounts often during the day, avoiding large meals; importance of adequate protein and discussed protein sources.   Nutritional Diagnosis:  [place]-1.4 Altered GI function As related to abdominal pain and diarrhea.  As evidenced by IBS, patient report of food intake and symptoms..  Intervention: Instruction as noted above.   Set goals with direction from patient.   She will begin choosing more low fodmap foods and continue with fiber and probiotic supplements.    She agrees to increase protein intake.    No follow-up scheduled at this time, patient will schedule later if needed.  Education Materials given:  Marland Kitchen. Low Fodmap/ High Fodmap foods list and grocery list . IBS Nutrition Therapy (NCM) . Goals/ instructions  Learner/ who was taught:  . Patient   Level of understanding: Marland Kitchen. Verbalizes/ demonstrates competency  Demonstrated degree of understanding via:   Teach back Learning barriers: . None  Willingness to learn/ readiness for change: . Eager, change in progress  Monitoring and Evaluation:  Dietary intake, IBS symptoms, and body weight      follow up: prn

## 2017-07-30 ENCOUNTER — Encounter: Payer: Self-pay | Admitting: Family Medicine

## 2017-07-30 ENCOUNTER — Ambulatory Visit: Payer: 59 | Admitting: Family Medicine

## 2017-07-30 VITALS — BP 110/70 | HR 103 | Temp 99.1°F | Resp 16 | Ht 59.0 in | Wt 103.5 lb

## 2017-07-30 DIAGNOSIS — J0140 Acute pansinusitis, unspecified: Secondary | ICD-10-CM

## 2017-07-30 MED ORDER — FLUTICASONE PROPIONATE 50 MCG/ACT NA SUSP: 2.0000 | Freq: Every day | NASAL | 6 refills | Status: DC

## 2017-07-30 MED ORDER — DOXYCYCLINE HYCLATE 100 MG PO TABS: 100.0000 mg | ORAL_TABLET | Freq: Two times a day (BID) | ORAL | 0 refills | Status: AC

## 2017-07-30 NOTE — Progress Notes (Signed)
Name: Elizabeth ScottRachel M Velez   MRN: [id number]    DOB: 02/19/1996   Date:07/30/2017       Progress Note  Subjective  Chief Complaint  Chief Complaint  Patient presents with  . Sinusitis    congested, headache, cough, stuffy nose for 1 week    HPI  Pt presents with upper respiratory illness for over a week - started with nasal congestion and cough along with fatigue, started to feel better over the weekend, then began feeling worse again about 2 nights ago.  She notes significant sinus pressure and pain along with a migraine (has chronic migraines), intermittent cough.  Denies fevers/chills, chest pain, shortness of breath, body aches.  She took dayquil today without relief of symptoms.  Patient Active Problem List   Diagnosis Date Noted  . Preventative health care 12/02/2016  . Chronic abdominal pain 09/06/2016  . History of rectal bleeding 09/06/2016  . Abnormal loss of weight 07/15/2016  . Diarrhea, unspecified 07/15/2016  . Depressive disorder 02/12/2016  . Muscle cramps 02/12/2016  . Joint crepitus 12/29/2015  . Rash 12/29/2015  . Raynaud phenomenon 12/29/2015  . Unexplained night sweats 12/29/2015  . Screening for HIV (human immunodeficiency virus) 12/08/2015  . Encounter for cholesteral screening for cardiovascular disease 03/29/2015  . Heart murmur previously undiagnosed 03/29/2015  . Oral contraceptive pill surveillance 03/29/2015  . 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 Fatigue 08/02/2014  . Major depressive disorder, recurrent, in remission (HCC) 08/02/2014    Social History   Tobacco Use  . Smoking status: Never Smoker  . Smokeless tobacco: Never Used  Substance Use Topics  . Alcohol use: No    Alcohol/week: 0.0 oz    Frequency: Never    Comment: only once a month     Current Outpatient Medications:  .  dicyclomine (BENTYL) 20 MG tablet, Take 1 tablet (20 mg total) 4 (four) times daily -  before meals and at bedtime by mouth., Disp: 30  tablet, Rfl: 0 .  etonogestrel (NEXPLANON) 68 MG IMPL implant, 1 each once by Subdermal route., Disp: , Rfl:  .  FLUoxetine (PROZAC) 20 MG tablet, Take 20 mg by mouth daily. , Disp: , Rfl:  .  lamoTRIgine (LAMICTAL) 200 MG tablet, Take by mouth., Disp: , Rfl:  .  LORazepam (ATIVAN) 0.5 MG tablet, Take by mouth., Disp: , Rfl:  .  doxycycline (VIBRA-TABS) 100 MG tablet, Take 1 tablet (100 mg total) by mouth 2 (two) times daily for 7 days., Disp: 14 tablet, Rfl: 0 .  fluticasone (FLONASE) 50 MCG/ACT nasal spray, Place 2 sprays into both nostrils daily., Disp: 16 g, Rfl: 6 .  Melatonin 3 MG TABS, Take by mouth as needed. , Disp: , Rfl:   Allergies  Allergen Reactions  . Penicillins   . Zithromax [Azithromycin]     ROS Constitutional: Negative for fever or weight change.  Respiratory: Negative for  shortness of breath.   Cardiovascular: Negative for chest pain or palpitations.  Gastrointestinal: Negative for abdominal pain, vomiting, or bowel changes.  Musculoskeletal: Negative for gait problem or joint swelling.  Skin: Negative for rash.  Neurological: Negative for dizziness; positive for headache No other specific complaints in a complete review of systems (except as listed in HPI above).  Objective  Vitals:   07/30/17 1127  BP: 110/70  Pulse: (!) 103  Resp: 16  Temp: 99.1 F (37.3 C)  TempSrc: Oral  SpO2: 94%  Weight: 103 lb 8 oz (46.9 kg)  Height:  4\' 11"  (1.499 m)   Body mass index is 20.9 kg/m.  Nursing Note and Vital Signs reviewed.  Physical Exam  Constitutional: Patient appears well-developed and well-nourished. Obese No distress.  HEENT: head atraumatic, normocephalic, pupils equal and reactive to light, EOM's intact, TM's without erythema or bulging, positive for bilateral maxillary and frontal sinus tenderness, neck supple with mild bilateral submandibular lymphadenopathy, oropharynx mildly erythematous and moist without exudate Cardiovascular: Normal rate,  regular rhythm, S1/S2 present.  No murmur or rub heard. No BLE edema. Pulmonary/Chest: Effort normal and breath sounds clear. No respiratory distress or retractions. Psychiatric: Patient has a normal mood and affect. behavior is normal. Judgment and thought content normal.  No results found for this or any previous visit (from the past 72 hour(s)).  Assessment & Plan  1. Acute non-recurrent pansinusitis - fluticasone (FLONASE) 50 MCG/ACT nasal spray; Place 2 sprays into both nostrils daily.  Dispense: 16 g; Refill: 6 - doxycycline (VIBRA-TABS) 100 MG tablet; Take 1 tablet (100 mg total) by mouth 2 (two) times daily for 7 days.  Dispense: 14 tablet; Refill: 0 - Advised that she push plenty of fluids and rest as HR is mildly elevated at 103bpm.  -Red flags and when to present for emergency care or RTC including fever >101.35F, chest pain, shortness of breath, new/worsening/un-resolving symptoms,  reviewed with patient at time of visit. Follow up and care instructions discussed and provided in AVS.

## 2017-07-30 NOTE — Patient Instructions (Addendum)
Cool Mist Vaporizer A cool mist vaporizer is a device that releases a cool mist into the air. If you have a cough or a cold, using a vaporizer may help relieve your symptoms. The mist adds moisture to the air, which may help thin your mucus and make it less sticky. When your mucus is thin and less sticky, it easier for you to breathe and to cough up secretions. Do not use a vaporizer if you are allergic to mold. Follow these instructions at home:  Follow the instructions that come with the vaporizer.  Do not use anything other than distilled water in the vaporizer.  Do not run the vaporizer all of the time. Doing that can cause mold or bacteria to grow in the vaporizer.  Clean the vaporizer after each time that you use it.  Clean and dry the vaporizer well before storing it.  Stop using the vaporizer if your breathing symptoms get worse. This information is not intended to replace advice given to you by your health care provider. Make sure you discuss any questions you have with your health care provider. Document Released: 02/29/2004 Document Revised: 12/22/2015 Document Reviewed: 09/02/2015 Elsevier Interactive Patient Education  2018 Elsevier Inc.   Sinusitis, Adult Sinusitis is soreness and inflammation of your sinuses. Sinuses are hollow spaces in the bones around your face. They are located:  Around your eyes.  In the middle of your forehead.  Behind your nose.  In your cheekbones.  Your sinuses and nasal passages are lined with a stringy fluid (mucus). Mucus normally drains out of your sinuses. When your nasal tissues get inflamed or swollen, the mucus can get trapped or blocked so air cannot flow through your sinuses. This lets bacteria, viruses, and funguses grow, and that leads to infection. Follow these instructions at home: Medicines  Take, use, or apply over-the-counter and prescription medicines only as told by your doctor. These may include nasal sprays.  If you  were prescribed an antibiotic medicine, take it as told by your doctor. Do not stop taking the antibiotic even if you start to feel better. Hydrate and Humidify  Drink enough water to keep your pee (urine) clear or pale yellow.  Use a cool mist humidifier to keep the humidity level in your home above 50%.  Breathe in steam for 10-15 minutes, 3-4 times a day or as told by your doctor. You can do this in the bathroom while a hot shower is running.  Try not to spend time in cool or dry air. Rest  Rest as much as possible.  Sleep with your head raised (elevated).  Make sure to get enough sleep each night. General instructions  Put a warm, moist washcloth on your face 3-4 times a day or as told by your doctor. This will help with discomfort.  Wash your hands often with soap and water. If there is no soap and water, use hand sanitizer.  Do not smoke. Avoid being around people who are smoking (secondhand smoke).  Keep all follow-up visits as told by your doctor. This is important. Contact a doctor if:  You have a fever.  Your symptoms get worse.  Your symptoms do not get better within 10 days. Get help right away if:  You have a very bad headache.  You cannot stop throwing up (vomiting).  You have pain or swelling around your face or eyes.  You have trouble seeing.  You feel confused.  Your neck is stiff.  You have trouble   breathing. This information is not intended to replace advice given to you by your health care provider. Make sure you discuss any questions you have with your health care provider. Document Released: 11/20/2007 Document Revised: 01/28/2016 Document Reviewed: 03/29/2015 Elsevier Interactive Patient Education  2018 Elsevier Inc.  

## 2017-08-07 ENCOUNTER — Encounter: Payer: Self-pay | Admitting: Family Medicine

## 2017-08-07 ENCOUNTER — Ambulatory Visit: Payer: BC Managed Care – PPO | Admitting: Family Medicine

## 2017-08-07 VITALS — BP 100/70 | HR 100 | Temp 98.3°F | Resp 18 | Ht 59.0 in | Wt 101.3 lb

## 2017-08-07 DIAGNOSIS — G43009 Migraine without aura, not intractable, without status migrainosus: Secondary | ICD-10-CM

## 2017-08-07 MED ORDER — KETOROLAC TROMETHAMINE 60 MG/2ML IM SOLN
60.0000 mg | Freq: Once | INTRAMUSCULAR | Status: AC
  Administered 2017-08-07: 60 mg via INTRAMUSCULAR

## 2017-08-07 NOTE — Progress Notes (Signed)
Name: Elizabeth ScottRachel M Velez   MRN: [id number]    DOB: 26-Oct-1995   Date:08/07/2017       Progress Note  Subjective  Chief Complaint  Chief Complaint  Patient presents with  . Migraine    for 2 days    HPI  Pt presents with concern for migraine x2 days - has history migraines in the past, but had improved after receiving the nexplanon implant.  Over the last several weeks her migraines have started to recur - she is taking feverfew daily as preventive; has never taken a daily preventive medication.  Endorses Photosensitivity, some visual floaters, phonosensitivity, mild nausea.  Denies vomiting, extremity weakness, confusion, speech changes.  She drove herself to the office today, so we will avoid sedative medications today.   Patient Active Problem List   Diagnosis Date Noted  . Preventative health care 12/02/2016  . Chronic abdominal pain 09/06/2016  . History of rectal bleeding 09/06/2016  . Abnormal loss of weight 07/15/2016  . Diarrhea, unspecified 07/15/2016  . Depressive disorder 02/12/2016  . Muscle cramps 02/12/2016  . Joint crepitus 12/29/2015  . Rash 12/29/2015  . Raynaud phenomenon 12/29/2015  . Unexplained night sweats 12/29/2015  . Screening for HIV (human immunodeficiency virus) 12/08/2015  . Encounter for cholesteral screening for cardiovascular disease 03/29/2015  . Heart murmur previously undiagnosed 03/29/2015  . Oral contraceptive pill surveillance 03/29/2015  . 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 Fatigue 08/02/2014  . Major depressive disorder, recurrent, in remission (HCC) 08/02/2014    Social History   Tobacco Use  . Smoking status: Never Smoker  . Smokeless tobacco: Never Used  Substance Use Topics  . Alcohol use: No    Alcohol/week: 0.0 oz    Frequency: Never    Comment: only once a month     Current Outpatient Medications:  .  etonogestrel (NEXPLANON) 68 MG IMPL implant, 1 each once by Subdermal route., Disp: , Rfl:  .   FLUoxetine (PROZAC) 20 MG tablet, Take 20 mg by mouth daily. , Disp: , Rfl:  .  lamoTRIgine (LAMICTAL) 200 MG tablet, Take by mouth., Disp: , Rfl:  .  LORazepam (ATIVAN) 0.5 MG tablet, Take by mouth., Disp: , Rfl:  .  Melatonin 3 MG TABS, Take by mouth as needed. , Disp: , Rfl:  .  dicyclomine (BENTYL) 20 MG tablet, Take 1 tablet (20 mg total) 4 (four) times daily -  before meals and at bedtime by mouth. (Patient not taking: Reported on 08/07/2017), Disp: 30 tablet, Rfl: 0 .  fluticasone (FLONASE) 50 MCG/ACT nasal spray, Place 2 sprays into both nostrils daily. (Patient not taking: Reported on 08/07/2017), Disp: 16 g, Rfl: 6  Allergies  Allergen Reactions  . Penicillins   . Zithromax [Azithromycin]     ROS  Constitutional: Negative for fever or weight change.  Respiratory: Negative for cough and shortness of breath.   Cardiovascular: Negative for chest pain or palpitations.  Gastrointestinal: Negative for abdominal pain, no bowel changes.  Musculoskeletal: Negative for gait problem or joint swelling.  Skin: Negative for rash.  Neurological: Negative for dizziness; see HPI No other specific complaints in a complete review of systems (except as listed in HPI above).  Objective  Vitals:   08/07/17 1529  BP: 100/70  Pulse: 100  Resp: 18  Temp: 98.3 F (36.8 C)  TempSrc: Oral  SpO2: 96%  Weight: 101 lb 4.8 oz (45.9 kg)  Height: 4\' 11"  (1.499 m)   Body mass index is  20.46 kg/m.  Nursing Note and Vital Signs reviewed.  Physical Exam  Constitutional: Patient appears well-developed and well-nourished.  No distress.  HEENT: head atraumatic, normocephalic, pupils equal and reactive to light, EOM's intact, TM's without erythema or bulging, no maxillary or frontal sinus tenderness , neck supple without lymphadenopathy, oropharynx pink and moist without exudate Cardiovascular: Normal rate, regular rhythm, S1/S2 present.  No murmur or rub heard. No BLE edema. Pulmonary/Chest: Effort  normal and breath sounds clear. No respiratory distress or retractions. Psychiatric: Patient has a normal mood and affect. behavior is normal. Judgment and thought content normal. Neurological: she is alert and oriented to person, place, and time. No cranial nerve deficit. Coordination, balance, strength, speech and gait are normal.  Skin: Skin is warm and dry. No rash noted. No erythema.   No results found for this or any previous visit (from the past 72 hour(s)).  Assessment & Plan  1. Migraine without aura and without status migrainosus, not intractable - ketorolac (TORADOL) injection 60 mg - Discussed daily medication options, pt declines at this time, but will consider in the future. - We will avoid sedative medication today as she drove herself to the office. If headache does not resolve by tomorrow, she will call to discuss options - discussed fioricet as alternative option if needed.  -Red flags and when to present for emergency care or RTC including fever >101.71F, chest pain, shortness of breath, new/worsening/un-resolving symptoms, neurologic changes of any kind reviewed with patient at time of visit. Follow up and care instructions discussed and provided in AVS.

## 2017-08-08 ENCOUNTER — Encounter: Payer: Self-pay | Admitting: Family Medicine

## 2017-08-08 NOTE — Telephone Encounter (Signed)
Please give patient a call regarding what else she can do for headache. She is not much better

## 2017-08-11 ENCOUNTER — Ambulatory Visit: Payer: BC Managed Care – PPO | Admitting: Family Medicine

## 2017-08-14 ENCOUNTER — Ambulatory Visit: Payer: BC Managed Care – PPO | Admitting: Family Medicine

## 2017-08-14 ENCOUNTER — Encounter: Payer: Self-pay | Admitting: Family Medicine

## 2017-08-14 VITALS — BP 104/62 | HR 100 | Temp 98.2°F | Resp 16 | Ht 59.0 in | Wt 100.3 lb

## 2017-08-14 DIAGNOSIS — G43009 Migraine without aura, not intractable, without status migrainosus: Secondary | ICD-10-CM

## 2017-08-14 MED ORDER — ERENUMAB-AOOE 70 MG/ML ~~LOC~~ SOAJ: 70.0000 mg | SUBCUTANEOUS | 0 refills | Status: DC

## 2017-08-14 MED ORDER — BUTALBITAL-APAP-CAFFEINE 50-325-40 MG PO TABS: 1.0000 | ORAL_TABLET | Freq: Four times a day (QID) | ORAL | 0 refills | Status: DC | PRN

## 2017-08-14 NOTE — Patient Instructions (Signed)
Recurrent Migraine Headache °A migraine headache is very bad, throbbing pain that is usually on one side of your head. Recurrent migraines keep coming back (recurring). Talk with your doctor about what things may bring on (trigger) your migraine headaches. °Follow these instructions at home: °Medicines  °· Take over-the-counter and prescription medicines only as told by your doctor. °· Do not drive or use heavy machinery while taking prescription pain medicine. °Lifestyle  °· Do not use any products that contain nicotine or tobacco, such as cigarettes and e-cigarettes. If you need help quitting, ask your doctor. °· Limit alcohol intake to no more than 1 drink a day for nonpregnant women and 2 drinks a day for men. One drink equals 12 oz of beer, 5 oz of wine, or 1½ oz of hard liquor. °· Get 7-9 hours of sleep each night. °· Lessen any stress in your life. Ask your doctor about ways to lower your stress. °· Stay at a healthy weight. Talk with your doctor if you need help losing weight. °· Get regular exercise. °General instructions  °· Keep a journal to find out if certain things bring on migraine headaches. For example, write down: °¨ What you eat and drink. °¨ How much sleep you get. °¨ Any change to your diet or medicines. °· Lie down in a dark, quiet room when you have a migraine. °· Try placing a cool towel over your head when you have a migraine. °· Keep lights dim if bright lights bother you or make your migraines worse. °· Keep all follow-up visits as told by your doctor. This is important. °Contact a doctor if: °· Medicine does not help your migraines. °· Your pain keeps coming back. °· You have a fever. °· You have weight loss without trying. °Get help right away if: °· Your migraine becomes really bad and medicine does not help. °· You have a stiff neck. °· You have trouble seeing. °· Your muscles are weak or you lose control of your muscles. °· You lose your balance or have trouble walking. °· You feel  like you will pass out (faint) or you pass out. °· You have really bad symptoms that are different than your first symptoms. °· You start having sudden, very bad headaches that last for one second or less, like a thunderclap. °Summary °· A migraine headache is very bad, throbbing pain that is usually on one side of your head. °· Talk with your doctor about what things may bring on (trigger) your migraine headaches. °· Take over-the-counter and prescription medicines only as told by your doctor. °· Lie down in a dark, quiet room when you have a migraine. °· Keep a journal about what you eat and drink, how much sleep you get, and any changes to your medicines. This can help you find out if certain things make you have migraine headaches. °This information is not intended to replace advice given to you by your health care provider. Make sure you discuss any questions you have with your health care provider. °Document Released: 03/12/2008 Document Revised: 04/26/2016 Document Reviewed: 04/26/2016 °Elsevier Interactive Patient Education © 2017 Elsevier Inc. ° °

## 2017-08-14 NOTE — Progress Notes (Signed)
Name: Elizabeth Velez   MRN: [id number]    DOB: [birth year]   Date:08/14/2017       Progress Note  Subjective  Chief Complaint  Chief Complaint  Patient presents with  . Follow-up    headache    HPI  Pt presents with ongoing intermittent migraines.  Today she is experiencing a dull headache only.  She has had too numerous to count migraine days this month.  Has history migraines in the past, but had improved after receiving the nexplanon implant.  Over the last several weeks her migraines have started to recur - she is taking feverfew daily as preventive and it does not seem to be working. Currently experiencing photosensitivity.  When she does experience migraine her symptoms include photosensitivity, some visual floaters, phonosensitivity, and mild nausea.  Denies vomiting, extremity weakness, confusion, speech changes.  She has tried excedrine without relief.  She takes Lamictal and prozac, so we will avoid Topamax/anti-seizure medication at this time.  Takes Lorazepam PRN (Has not taken in over 6-9 months) and Melatonin (Has taken 1-2 times in the last month).   Patient Active Problem List   Diagnosis Date Noted  . Preventative health care 12/02/2016  . Chronic abdominal pain 09/06/2016  . History of rectal bleeding 09/06/2016  . Abnormal loss of weight 07/15/2016  . Diarrhea, unspecified 07/15/2016  . Depressive disorder 02/12/2016  . Muscle cramps 02/12/2016  . Joint crepitus 12/29/2015  . Rash 12/29/2015  . Raynaud phenomenon 12/29/2015  . Unexplained night sweats 12/29/2015  . Screening for HIV (human immunodeficiency virus) 12/08/2015  . Encounter for cholesteral screening for cardiovascular disease 03/29/2015  . Heart murmur previously undiagnosed 03/29/2015  . Oral contraceptive pill surveillance 03/29/2015  . 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 Fatigue 08/02/2014  . Major depressive disorder, recurrent, in remission (HCC) 08/02/2014    Social  History   Tobacco Use  . Smoking status: Never Smoker  . Smokeless tobacco: Never Used  Substance Use Topics  . Alcohol use: No    Alcohol/week: 0.0 oz    Frequency: Never    Comment: only once a month     Current Outpatient Medications:  .  dicyclomine (BENTYL) 20 MG tablet, Take 1 tablet (20 mg total) 4 (four) times daily -  before meals and at bedtime by mouth., Disp: 30 tablet, Rfl: 0 .  etonogestrel (NEXPLANON) 68 MG IMPL implant, 1 each once by Subdermal route., Disp: , Rfl:  .  FLUoxetine (PROZAC) 20 MG tablet, Take 20 mg by mouth daily. , Disp: , Rfl:  .  fluticasone (FLONASE) 50 MCG/ACT nasal spray, Place 2 sprays into both nostrils daily., Disp: 16 g, Rfl: 6 .  lamoTRIgine (LAMICTAL) 200 MG tablet, Take by mouth., Disp: , Rfl:  .  LORazepam (ATIVAN) 0.5 MG tablet, Take by mouth., Disp: , Rfl:  .  Melatonin 3 MG TABS, Take by mouth as needed. , Disp: , Rfl:  .  butalbital-acetaminophen-caffeine (FIORICET, ESGIC) 50-325-40 MG tablet, Take 1 tablet by mouth every 6 (six) hours as needed for headache or migraine., Disp: 20 tablet, Rfl: 0 .  Erenumab-aooe (AIMOVIG) 70 MG/ML SOAJ, Inject 70 mg into the skin every 30 (thirty) days., Disp: 3 pen, Rfl: 0  Allergies  Allergen Reactions  . Penicillins   . Zithromax [Azithromycin]     ROS  Ten systems reviewed and is negative except as mentioned in HPI  Objective  Vitals:   08/14/17 1529  BP: 104/62  Pulse: 100  Resp: 16  Temp: 98.2 F (36.8 C)  TempSrc: Oral  SpO2: 95%  Weight: 100 lb 4.8 oz (45.5 kg)  Height: 4\' 11"  (1.499 m)   Body mass index is 20.26 kg/m.  Nursing Note and Vital Signs reviewed.  Physical Exam  Constitutional: Patient appears well-developed and well-nourished.  No distress.  HEENT: head atraumatic, normocephalic, pupils equal and reactive to light, EOM's intact Cardiovascular: Normal rate, regular rhythm, S1/S2 present.  No murmur or rub heard. No BLE edema. Pulmonary/Chest: Effort normal  and breath sounds clear. No respiratory distress or retractions. Psychiatric: Patient has a normal mood and affect. behavior is normal. Judgment and thought content normal. Neurological: she is alert and oriented to person, place, and time. No cranial nerve deficit. Coordination, balance, strength, speech and gait are normal.  Skin: Skin is warm and dry. No rash noted. No erythema.  Psychiatric: Patient has a normal mood and affect. behavior is normal. Judgment and thought content normal.  No results found for this or any previous visit (from the past 72 hour(s)).  Assessment & Plan  1. Migraine without aura and without status migrainosus, not intractable - butalbital-acetaminophen-caffeine (FIORICET, ESGIC) 50-325-40 MG tablet; Take 1 tablet by mouth every 6 (six) hours as needed for headache or migraine.  Dispense: 20 tablet; Refill: 0 - Erenumab-aooe (AIMOVIG) 70 MG/ML SOAJ; Inject 70 mg into the skin every 30 (thirty) days.  Dispense: 3 pen; Refill: 0 Discussed risk/benefits and potential side effects of fioricet, pt would like to try this today.  She is in agreement to not take ativan while taking Fioricet.   Discussed [employer] for prevention - we will provide sample in office today and assist her in completing paperwork for patient assistance via [employer]. - Migraine diet handout provided; advised to avoid caffeine, drink plenty of water, stress reduction techniques are discussed.  - Return in about 1 month (around 09/11/2017) for Migraine Follow up w/ PCP.

## 2017-09-02 ENCOUNTER — Telehealth: Payer: Self-pay | Admitting: Family Medicine

## 2017-09-02 NOTE — Telephone Encounter (Signed)
Received request for alternative from pharmacy - requesting Ajovy or Emgality.  Was patient able to file a request with [employer] to obtain 12 months free?    If not, could you please either assist her in completion of the paperwork or let me know [facility]?

## 2017-09-03 NOTE — Telephone Encounter (Signed)
Paperwork filled out, left message for patient to stop by to fill her part out to be sent to pharmaceutical company for [employer]

## 2017-09-05 ENCOUNTER — Telehealth: Payer: Self-pay | Admitting: Family Medicine

## 2017-09-05 NOTE — Telephone Encounter (Signed)
Copied from CRM [phone]#[id number]. Topic: Quick Communication - See Telephone Encounter >> Sep 05, 2017  2:05 PM Cipriano BunkerLambe, Annette S wrote: CRM for notification.  Maretta BeesMindy  - [employer] -- [phone] -  Service request form they received is missing the diagnoses code.  Please refax with information.  Fax # [phone]  See Telephone encounter for: 09/05/17.

## 2017-09-05 NOTE — Telephone Encounter (Signed)
Elizabeth JacobsonHelen can you refax this, I believe you and Irving Burtonmily have been taking care of the patient. Thanks

## 2017-09-08 NOTE — Telephone Encounter (Signed)
Paper work faxed over, spoke to [employer]

## 2017-09-11 ENCOUNTER — Ambulatory Visit: Payer: BC Managed Care – PPO | Admitting: Family Medicine

## 2017-09-26 ENCOUNTER — Encounter: Payer: Self-pay | Admitting: Nurse Practitioner

## 2017-09-26 ENCOUNTER — Ambulatory Visit: Payer: BC Managed Care – PPO | Admitting: Nurse Practitioner

## 2017-09-26 VITALS — BP 104/68 | HR 100 | Temp 98.5°F | Resp 16 | Ht 59.0 in | Wt 99.7 lb

## 2017-09-26 DIAGNOSIS — R61 Generalized hyperhidrosis: Secondary | ICD-10-CM

## 2017-09-26 DIAGNOSIS — G43009 Migraine without aura, not intractable, without status migrainosus: Secondary | ICD-10-CM | POA: Diagnosis not present

## 2017-09-26 DIAGNOSIS — R634 Abnormal weight loss: Secondary | ICD-10-CM

## 2017-09-26 DIAGNOSIS — F3340 Major depressive disorder, recurrent, in remission, unspecified: Secondary | ICD-10-CM

## 2017-09-26 DIAGNOSIS — K589 Irritable bowel syndrome without diarrhea: Secondary | ICD-10-CM | POA: Insufficient documentation

## 2017-09-26 DIAGNOSIS — K580 Irritable bowel syndrome with diarrhea: Secondary | ICD-10-CM

## 2017-09-26 NOTE — Patient Instructions (Signed)
-   Continue eating more consistently  - choose high-protein options - we will check labs today; may refer to GI again if no improvement  If you have not heard anything from my staff in a week about any orders/referrals/studies from today, please contact us here to follow-up [phone]  Low-Gluten Eating Plan Gluten is a protein that is found in wheat, barley, rye, and some other grains. Some people have a condition that makes them unable to digest gluten. For those people, eating just a small amount of gluten can damage their intestines. This is not a gluten-free eating plan. This low-gluten eating plan is for people who feel better when they eat less gluten. What do I need to know about this eating plan?  You can eat anything that does not contain wheat or other grains that have gluten.  You can eat anything that is labeled "gluten-free."  Make sure to read food labels.  Eat a variety of foods so you get all of the nutrients that you need.  Avoid processed foods and sauces because many of them contain wheat. To have more control over the ingredients in your meals, consider making food yourself instead of buying prepared foods. What foods can I eat? Grains Rice. Bulgur. Quinoa. Corn. Buckwheat. Amaranth. Corn tortillas or taco shells. Oatmeal that is labeled as "gluten free" or "uncontaminated." Vegetables Lettuce. Spinach. Peas. Beets. Cauliflower. Cabbage. Broccoli. Carrots. Tomatoes. Squash. Eggplant. Herbs. Peppers. Onions. Cucumbers. Brussels sprouts. Yams and sweet potatoes. Beans. Lentils. Fruits Bananas. Apples. Oranges. Grapes. Papaya. Mango. Pomegranate. Kiwi. Grapefruit. Cherries. Meats and Other Protein Sources Beef. Pork. Chicken. [place]urkey. Fish. Eggs. Tofu. Beans. Nuts. Lentils. Dairy Milk. Ice cream. Yogurt. Cheese. Cottage cheese. Beverages Water. Coffee. Tea. Juice. Soda. Seltzer water. Condiments Mustard. Relish. Low-fat, low-sugar ketchup. Low-fat, low-sugar  barbecue sauce. Vinegar. Low-fat or fat-free mayonnaise. Sweets and Desserts Honey. Sugar. Maple syrup. Fats and Oils Butter. Vegetable oil. Olive oil. Canola oil. Walnut oil. Other Arrowroot or cornstarch. Potato flour. The items listed above may not be a complete list of recommended foods or beverages. Contact your dietitian for more options. What foods are not recommended? Grains Wheat. Barley. Rye. Oatmeal. Meat and Other Protein Sources Seitan. Cold cuts. Hotdogs. Salami. Sausages. Beverages Beer. Condiments Malt vinegar. Salad dressing. Soy sauce. Sweets and Desserts Licorice. Brown rice syrup. Pre-made pudding or pudding mixes. Other Bouillon cubes. Canned or boxed pre-made soups or soup packets. Bagged chips, such as potato chips and tortilla chips. Seasoning packets. The items listed above may not be a complete list of foods and beverages to avoid. Contact your dietitian for more information. This information is not intended to replace advice given to you by your health care provider. Make sure you discuss any questions you have with your health care provider. Document Released: 10/18/2014 Document Revised: 11/09/2015 Document Reviewed: 06/29/2014 Elsevier Interactive Patient Education  [employer]2018 [employer].

## 2017-09-26 NOTE — Progress Notes (Addendum)
Name: Elizabeth Velez   MRN: [id number]    DOB: 1996-02-03   Date:09/26/2017       Progress Note  Subjective  Chief Complaint  Chief Complaint  Patient presents with  . Headache    follow up on Aomovig  . Weight Loss    HPI Headache Patient has a history of migraines that had become more frequent had tried feverfew and excedrin in the past with some relief. Last visit here on 2/28 with Irving Burton NP was rx fioricet and aimovig injections. Took shot on 3/29- states is working very well for her. Has one migraine since and it was gone quickly. Patient took fioricet once- and it helped her fall asleep and wake up without a headache. Patient states had been having 1-2 migraines a week that were long lasting and difficult to treat.   Weight loss Patient states shes noticed weight loss over the past few months. She noticed that she was loosing weight because she had clothes altered- taken in and then tried them a few week later and they were too loose again. Patient has IBS- diarrhea- bristol scale type 5. Positive for night sweats- states has had since 3- 4 years; has had work-up with PCP, GYN and psych- thought to be related to SSRI but no difference noted when she came off them so she went back on them. Denies fevers, chills, vomiting, decreased appetite. States she is a really bad eater- inconsistent eater and doesn't usually choose the healthiest foods. She has tried to be more consistent with eating- she has been eating breakfast, lunch, a snack and the dinner for the past 2 months. She eats a lot of chips, soda, pasta, cereal. No change in activity level.  Wt Readings from Last 3 Encounters:  09/26/17 99 lb 11.2 oz (45.2 kg)  08/14/17 100 lb 4.8 oz (45.5 kg)  08/07/17 101 lb 4.8 oz (45.9 kg)  11/29/2016 - 105lbs  IBS abdominal pain and lower cramping accompanied by soft stools- bristol 5. Patient denies blood in stools. States no bowel changes for the past year, increased with anxiety. Takes  bentyl as needed.    Patient Active Problem List   Diagnosis Date Noted  . Migraine without aura and without status migrainosus, not intractable 08/14/2017  . Preventative health care 12/02/2016  . Chronic abdominal pain 09/06/2016  . History of rectal bleeding 09/06/2016  . Abnormal loss of weight 07/15/2016  . Diarrhea, unspecified 07/15/2016  . Depressive disorder 02/12/2016  . Muscle cramps 02/12/2016  . Joint crepitus 12/29/2015  . Rash 12/29/2015  . Raynaud phenomenon 12/29/2015  . Unexplained night sweats 12/29/2015  . Screening for HIV (human immunodeficiency virus) 12/08/2015  . Encounter for cholesteral screening for cardiovascular disease 03/29/2015  . Heart murmur previously undiagnosed 03/29/2015  . Oral contraceptive pill surveillance 03/29/2015  . 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 Fatigue 08/02/2014  . Major depressive disorder, recurrent, in remission (HCC) 08/02/2014    Past Medical History:  Diagnosis Date  . Anxiety   . Bipolar 1 disorder, depressed (HCC)   . Depression   . Depression 08/02/2014  . Excessive daytime sleepiness 08/02/2014  . 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 Migraine     Past Surgical History:  Procedure Laterality Date  . WISDOM TOOTH EXTRACTION Bilateral 11/22/2016    Social History   Tobacco Use  . Smoking status: Never Smoker  . Smokeless tobacco: Never Used  Substance Use Topics  . Alcohol use:  No    Alcohol/week: 0.0 oz    Frequency: Never    Comment: only once a month     Current Outpatient Medications:  .  butalbital-acetaminophen-caffeine (FIORICET, ESGIC) 50-325-40 MG tablet, Take 1 tablet by mouth every 6 (six) hours as needed for headache or migraine., Disp: 20 tablet, Rfl: 0 .  dicyclomine (BENTYL) 20 MG tablet, Take 1 tablet (20 mg total) 4 (four) times daily -  before meals and at bedtime by mouth., Disp: 30 tablet, Rfl: 0 .  Erenumab-aooe (AIMOVIG) 70 MG/ML SOAJ,  Inject 70 mg into the skin every 30 (thirty) days., Disp: 3 pen, Rfl: 0 .  etonogestrel (NEXPLANON) 68 MG IMPL implant, 1 each once by Subdermal route., Disp: , Rfl:  .  FLUoxetine (PROZAC) 20 MG tablet, Take 20 mg by mouth daily. , Disp: , Rfl:  .  fluticasone (FLONASE) 50 MCG/ACT nasal spray, Place 2 sprays into both nostrils daily., Disp: 16 g, Rfl: 6 .  lamoTRIgine (LAMICTAL) 200 MG tablet, Take by mouth., Disp: , Rfl:  .  LORazepam (ATIVAN) 0.5 MG tablet, Take by mouth., Disp: , Rfl:  .  Melatonin 3 MG TABS, Take by mouth as needed. , Disp: , Rfl:   Allergies  Allergen Reactions  . Penicillins   . Zithromax [Azithromycin]     ROS   Constitutional: Negative for fever or  Endorses weight loss.  Respiratory: Negative for cough and shortness of breath.   Cardiovascular: Negative for chest pain or palpitations.  Gastrointestinal: Positive for abdominal pain-IBS, no bowel changes- chronic diarrhea.  Musculoskeletal: Negative for gait problem or joint swelling.  Skin: Negative for rash.  Neurological: Positive for dizziness- orthostatic  And headache- improved with medication.  No other specific complaints in a complete review of systems (except as listed in HPI above).  Objective  Vitals:   09/26/17 0826  BP: 104/68  Pulse: 100  Resp: 16  Temp: 98.5 F (36.9 C)  TempSrc: Oral  SpO2: 97%  Weight: 99 lb 11.2 oz (45.2 kg)  Height: 4\' 11"  (1.499 m)    Body mass index is 20.14 kg/m.  Nursing Note and Vital Signs reviewed.  Physical Exam  Constitutional: Patient appears well-developed and well-nourished.  No distress.  HEENT: head atraumatic, normocephalic, conjunctiva clear Cardiovascular: mildly elevated rate, regular rhythm, S1/S2 present.  No murmur or rub heard.  Pulmonary/Chest: Effort normal and breath sounds clear. No respiratory distress or retractions. Abdominal: Soft and non-tender, bowel sounds present, no cva tenderness MSK: no spinal tenderness, full ROM,  no muscle wasting Psychiatric: Patient has a normal mood and affect. behavior is normal. Judgment and thought content normal.  No results found for this or any previous visit (from the past 72 hour(s)).  Assessment & Plan  1. Unintentional weight loss -Discussed food diary; increasing protein; low gluten diet-she will follow-up with GI if symptoms unrelieved - Celiac Disease Comprehensive Panel with Reflexes - COMPLETE METABOLIC PANEL WITH GFR  2. Migraine without aura and without status migrainosus, not intractable -stable, no changes in treatment regimen; continue monthly injection and Fioricet as needed for pain 3. Major depressive disorder, recurrent, in remission (HCC) -stable, no changes in treatment regimen; continue management with psychiatry  4. Unexplained night sweats -Unchanged- already worked up; may be result of IBS- encourage proper diet    5. Irritable bowel syndrome with diarrhea -stable, continue current therapies in addition to low gluten- diet  -Red flags and when to present for emergency care or RTC including fever >101.16F,  chest pain, shortness of breath, new/worsening/un-resolving symptoms, reviewed with patient at time of visit. Follow up and care instructions discussed and provided in AVS.  ---------------------------------------------- I have reviewed this encounter including the documentation in this note and/or discussed this patient with the provider, Sharyon Cable DNP AGNP-C. I am certifying that I agree with the content of this note as supervising physician. Baruch Gouty, MD [facility] 09/27/2017, 1:45 PM

## 2017-09-29 LAB — COMPLETE METABOLIC PANEL WITH GFR
AG Ratio: 2.3 (calc) (ref 1.0–2.5)
ALT: 15 U/L (ref 6–29)
AST: 20 U/L (ref 10–30)
Albumin: 4.8 g/dL (ref 3.6–5.1)
Alkaline phosphatase (APISO): 49 U/L (ref 33–115)
BUN: 11 mg/dL (ref 7–25)
CALCIUM: 9.8 mg/dL (ref 8.6–10.2)
CO2: 32 mmol/L (ref 20–32)
Chloride: 102 mmol/L (ref 98–110)
Creat: 0.85 mg/dL (ref 0.50–1.10)
GFR, EST NON AFRICAN AMERICAN: 97 mL/min/{1.73_m2} (ref >=60)
GFR, Est African American: 112 mL/min/{1.73_m2} (ref >=60)
Globulin: 2.1 g/dL (calc) (ref 1.9–3.7)
Glucose, Bld: 86 mg/dL (ref 65–99)
Potassium: 4.2 mmol/L (ref 3.5–5.3)
Sodium: 138 mmol/L (ref 135–146)
Total Bilirubin: 0.8 mg/dL (ref 0.2–1.2)
Total Protein: 6.9 g/dL (ref 6.1–8.1)

## 2017-09-29 LAB — CELIAC DISEASE COMPREHENSIVE PANEL WITH REFLEXES
(TTG) AB, IGA: 1 U/mL
IMMUNOGLOBULIN A: 103 mg/dL (ref 81–463)

## 2017-12-01 ENCOUNTER — Telehealth: Payer: Self-pay | Admitting: Family Medicine

## 2017-12-01 MED ORDER — FLUOXETINE HCL 40 MG PO CAPS: 40.0000 mg | ORAL_CAPSULE | Freq: Every day | ORAL | 0 refills | Status: DC

## 2017-12-01 MED ORDER — LAMOTRIGINE 200 MG PO TABS: 200.0000 mg | ORAL_TABLET | Freq: Every day | ORAL | 0 refills | Status: AC

## 2017-12-01 NOTE — Telephone Encounter (Signed)
Refill  of Prozac and Lamictal  Historical Provider  LOV 09/26/17 E. Poulouse  Medication: FLUoxetine (PROZAC) 40 MG tablet lamoTRIgine (LAMICTAL) 200 MG tablet   Pt has new psychiatrist;cannot secure appt until 01/22/18.  She has 19 days left of Prozac,  Lamictal , one month.  Please call if cannot refill.   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   If appropriate:  [facility] [address] [place], [place] [id number] [phone]  Requesting call back if cannot refill. # [phone]

## 2017-12-01 NOTE — Telephone Encounter (Signed)
I spoke with patient She is leaving in the morning to go to [place] She is doing well Feels the medicine is working fine; no adjustments needed Just needs medicine refills until she can get established with new psychiatrist I'll be glad to cover with prescriptions

## 2017-12-01 NOTE — Telephone Encounter (Signed)
Copied from CRM [id number]. Topic: Quick Communication - Rx Refill/Question >> Dec 01, 2017 11:07 AM Cipriano BunkerLambe, Annette S wrote: Medication: FLUoxetine (PROZAC) 40 MG tablet lamoTRIgine (LAMICTAL) 200 MG tablet  Pt. Gets this through her psychiatrist Kirtland Bouchard([facility]-  but are no longer at practice. but will see [facility] [place] when she gets back has appt  8/8.)   She is needing refill,  they can not get her in until august. (was told they could not refill)  Pt. Leaves tomorrow (was just notified today of her dr. Burnard HawthorneGone and can't get refills.  She has 19 days left of Prozac, the Lamictal (has a month left)  Send prescriptions to pharmacy in [place].  Please call if can not help.   [place] in [place] summer.   Has the patient contacted their pharmacy? No. (Agent: If no, request that the patient contact the pharmacy for the refill.) (Agent: If yes, when and what did the pharmacy advise?)  Preferred Pharmacy (with phone number or street name):  [facility] [address] [place], [place] [address] [phone]  Agent: Please be advised that RX refills may take up to 3 business days. We ask that you follow-up with your pharmacy.

## 2017-12-08 ENCOUNTER — Other Ambulatory Visit: Payer: Self-pay | Admitting: Family Medicine

## 2017-12-08 ENCOUNTER — Telehealth: Payer: Self-pay | Admitting: Family Medicine

## 2017-12-08 DIAGNOSIS — G43009 Migraine without aura, not intractable, without status migrainosus: Secondary | ICD-10-CM

## 2017-12-08 MED ORDER — ERENUMAB-AOOE 70 MG/ML ~~LOC~~ SOAJ: 70.0000 mg | SUBCUTANEOUS | 0 refills | Status: DC

## 2017-12-08 NOTE — Telephone Encounter (Unsigned)
Copied from CRM [id number]. Topic: Quick Communication - Rx Refill/Question >> Dec 08, 2017  4:04 PM [name], [name] wrote: Medication: Erenumab-aooe (AIMOVIG) 70 MG/ML SOAJ  Has the patient contacted their pharmacy? yes   Preferred Pharmacy (with phone number or street name): [facility] [facility] [place], [place] - [address] [phone] (Phone) [phone] (Fax)   Agent: Please be advised that RX refills may take up to 3 business days. We ask that you follow-up with your pharmacy.

## 2017-12-09 NOTE — Telephone Encounter (Signed)
Called [facility] [place] regarding Aimovig refill  Spoke with Victorino DikeJennifer at the pharmacy and her refill will be ready tomorrow after 2 pm  Left VM for patient

## 2017-12-29 ENCOUNTER — Telehealth: Payer: Self-pay | Admitting: Family Medicine

## 2017-12-29 DIAGNOSIS — G43009 Migraine without aura, not intractable, without status migrainosus: Secondary | ICD-10-CM

## 2017-12-29 NOTE — Telephone Encounter (Signed)
Looks like Dr. Carlynn Purlsowles prescribed? Have you seen any questions or do anything through cover my meds?

## 2017-12-29 NOTE — Telephone Encounter (Signed)
Copied from CRM [phone]. Topic: General - Other >> Dec 29, 2017  4:06 PM Stephannie LiSimmons, [place] Severs L, NT wrote: Reason for UYQ:IHKVQCRM:Cover my meds called and said about prescription for  the Erenumab-aooe (AIMOVIG) 70 MG/ML SOAJ  for the patient ,her insurance sent additional questions to be answered please call [phone]   ref # A [id number] if any questions or assistance needed

## 2017-12-30 NOTE — Telephone Encounter (Signed)
I called CoverMyMeds and spoke with the rep, he initiated a new PA. PA was received and completed via CoverMyMeds   Josetta Glowacki (KeyPaulo Fruit: [id number]) - [id number] [id number] Aimovig 70MG /ML auto-injectors Status: PA Request Created: July 16th, 2019 [phone] Sent: July 16th, 2019

## 2018-01-01 ENCOUNTER — Other Ambulatory Visit: Payer: Self-pay | Admitting: Family Medicine

## 2018-01-01 MED ORDER — GALCANEZUMAB-GNLM 120 MG/ML ~~LOC~~ SOAJ: 120.0000 mg | SUBCUTANEOUS | 1 refills | Status: DC

## 2018-01-01 MED ORDER — GALCANEZUMAB-GNLM 120 MG/ML ~~LOC~~ SOSY: 240.0000 mg | PREFILLED_SYRINGE | Freq: Once | SUBCUTANEOUS | 0 refills | Status: DC

## 2018-01-01 NOTE — Addendum Note (Signed)
Addended by: Doren CustardBOYCE, Wahid Holley E on: 01/01/2018 02:25 PM   Modules accepted: Orders

## 2018-01-01 NOTE — Telephone Encounter (Signed)
Pt states she is back here and will need that re sent to the local pharmacy.  She will also be able to do loading dose here.

## 2018-01-01 NOTE — Telephone Encounter (Addendum)
Received PA denial for Aimovig.  I will provide new Rx as I originally prescribed the medication. New Rx is for Emgality (per instructions from PA) to replace Aimovig when she is due for her next month's injection.  The first month she needs to do 240mg  injection as a loading dose, subsequent months are 120mg  injection.  I see that she is in [place] for the summer, so I don't think she will be available to come in to the office for her loading dose, so [facility] [place] [facility].  She has performed Aimovig injection in office in the past and did well with this, but if she has any questions regarding administration, she needs to call us back for assistance.  **She also needs a follow up appointment as soon as she returns with her PCP, Dr. Sherie DonLada**  Thanks!

## 2018-01-02 MED ORDER — GALCANEZUMAB-GNLM 120 MG/ML ~~LOC~~ SOAJ: 120.0000 mg | SUBCUTANEOUS | 2 refills | Status: DC

## 2018-01-02 NOTE — Addendum Note (Signed)
Addended by: Doren CustardBOYCE, EMILY E on: 01/02/2018 09:31 AM   Modules accepted: Orders

## 2018-01-02 NOTE — Telephone Encounter (Signed)
Great news - thank you for checking in. [facility] [address], and please have her come in whenever her Aimovig would have been due to start with the loading dose of the Emgality. Thanks!

## 2018-01-02 NOTE — Telephone Encounter (Signed)
Left detailed voicemial 

## 2018-01-15 ENCOUNTER — Encounter: Payer: Self-pay | Admitting: Family Medicine

## 2018-01-15 ENCOUNTER — Ambulatory Visit: Payer: BC Managed Care – PPO | Admitting: Family Medicine

## 2018-01-15 VITALS — BP 110/68 | HR 93 | Temp 97.7°F | Resp 12 | Ht 59.0 in | Wt 106.5 lb

## 2018-01-15 DIAGNOSIS — J342 Deviated nasal septum: Secondary | ICD-10-CM | POA: Diagnosis not present

## 2018-01-15 DIAGNOSIS — F3340 Major depressive disorder, recurrent, in remission, unspecified: Secondary | ICD-10-CM

## 2018-01-15 DIAGNOSIS — G8929 Other chronic pain: Secondary | ICD-10-CM

## 2018-01-15 DIAGNOSIS — R51 Headache: Secondary | ICD-10-CM | POA: Diagnosis not present

## 2018-01-15 NOTE — Patient Instructions (Addendum)
Please give me an update after seeing the Ear Nose Throat doctor We'll have you return for the loading dose of Emgality, then you can do an injection once a month

## 2018-01-15 NOTE — Progress Notes (Signed)
BP 110/68   Pulse 93   Temp 97.7 F (36.5 C) (Oral)   Resp 12   Ht 4\' 11"  (1.499 m)   Wt 106 lb 8 oz (48.3 kg)   SpO2 97%   BMI 21.51 kg/m    Subjective:    Patient ID: Elizabeth Velez, female    DOB: April 19, 1996, 22 y.o.   MRN: [id number]  HPI: Elizabeth ScottRachel M Mark is a 22 y.o. female  Chief Complaint  Patient presents with  . 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; mood worsened and had to come home early Gene sight for response to different medicines; very different results for her; 100% were supposed to do what they should do, there are lots of options for her Only taking the lamictal for now Has not used the lorazepam in a long time; aware can be addictive; knocks her out anyway when she takes it nexplanon was tried for the cramps, but had full on periods for 2.5 months; removed and spotting is better; cramping is iffy; on OCP Getting about 2 migraines a month now; down significantly since the Aimovig Not sure about any triggers for migraines other than red meat, not eating that now She had been [facility] and [facility]  Depression screen [facility] 2/9 01/15/2018 05/29/2017 11/29/2016 12/08/2015 10/27/2015  Decreased Interest 0 0 0 0 0  Down, Depressed, Hopeless 1 1 0 1 0  PHQ - 2 Score 1 1 0 1 0  Altered sleeping 0 - - - -  Tired, decreased energy 0 - - - -  Change in appetite 0 - - - -  Feeling bad or failure about yourself  0 - - - -  Trouble concentrating 0 - - - -  Moving slowly or fidgety/restless 0 - - - -  Suicidal thoughts 0 - - - -  PHQ-9 Score 1 - - - -  Difficult doing work/chores Not difficult at all - - - -    Relevant past medical, surgical, family and social history reviewed Past Medical History:  Diagnosis Date    . Anxiety   . Bipolar 1 disorder, depressed (HCC)   . Depression   . Depression 08/02/2014  . Excessive daytime sleepiness 08/02/2014  . 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 Migraine    Past Surgical History:  Procedure Laterality Date  . WISDOM TOOTH EXTRACTION Bilateral 11/22/2016   Family History  Problem Relation Age of Onset  . Alzheimer's disease Maternal Grandmother   . Heart disease Maternal Grandfather   . Alcohol abuse Paternal Aunt   . Depression Maternal Aunt    Social History   Tobacco Use  . Smoking status: Never Smoker  . Smokeless tobacco: Never Used  Substance Use Topics  . Alcohol use: No    Alcohol/week: 0.0 oz    Frequency: Never    Comment: only once a month  . Drug use: No    Interim medical history since last visit reviewed. Allergies and medications reviewed  Review of Systems Per HPI unless specifically indicated above     Objective:    BP 110/68   Pulse 93   Temp 97.7 F (36.5 C) (Oral)   Resp 12   Ht 4\' 11"  (  1.499 m)   Wt 106 lb 8 oz (48.3 kg)   SpO2 97%   BMI 21.51 kg/m   Wt Readings from Last 3 Encounters:  01/15/18 106 lb 8 oz (48.3 kg)  09/26/17 99 lb 11.2 oz (45.2 kg)  08/14/17 100 lb 4.8 oz (45.5 kg)    Physical Exam  Constitutional: She appears well-developed and well-nourished. No distress.  HENT:  Head: Normocephalic and atraumatic.  Nose: Septal deviation (rightward) present. No mucosal edema or rhinorrhea.  Mouth/Throat: No posterior oropharyngeal edema or posterior oropharyngeal erythema.  Eyes: EOM are normal. No scleral icterus.  Neck: No thyromegaly present.  Cardiovascular: Normal rate, regular rhythm and normal heart sounds.  No murmur heard. Pulmonary/Chest: Effort normal and breath sounds normal. No respiratory distress. She has no wheezes.  Abdominal: Soft. Bowel sounds are normal. She exhibits no distension.  Musculoskeletal: Normal range of motion. She exhibits no edema.   Neurological: She is alert. She exhibits normal muscle tone.  Skin: Skin is warm and dry. She is not diaphoretic. No pallor.  Psychiatric: She has a normal mood and affect. Her behavior is normal. Judgment and thought content normal.    Results for orders placed or performed in visit on 09/26/17  Celiac Disease Comprehensive Panel with Reflexes  Result Value Ref Range   INTERPRETATION     (tTG) Ab, IgA 1 U/mL   Immunoglobulin A 103 81 - 463 mg/dL  COMPLETE METABOLIC PANEL WITH GFR  Result Value Ref Range   Glucose, Bld 86 65 - 99 mg/dL   BUN 11 7 - 25 mg/dL   Creat 1.61 0.96 - 0.45 mg/dL   GFR, Est Non African American 97 > OR = 60 mL/min/1.60m2   GFR, Est African American 112 > OR = 60 mL/min/1.11m2   BUN/Creatinine Ratio NOT APPLICABLE 6 - 22 (calc)   Sodium 138 135 - 146 mmol/L   Potassium 4.2 3.5 - 5.3 mmol/L   Chloride 102 98 - 110 mmol/L   CO2 32 20 - 32 mmol/L   Calcium 9.8 8.6 - 10.2 mg/dL   Total Protein 6.9 6.1 - 8.1 g/dL   Albumin 4.8 3.6 - 5.1 g/dL   Globulin 2.1 1.9 - 3.7 g/dL (calc)   AG Ratio 2.3 1.0 - 2.5 (calc)   Total Bilirubin 0.8 0.2 - 1.2 mg/dL   Alkaline phosphatase (APISO) 49 33 - 115 U/L   AST 20 10 - 30 U/L   ALT 15 6 - 29 U/L      Assessment & Plan:   Problem List Items Addressed This Visit      Other   Major depressive disorder, recurrent, in remission (HCC)    Managed by psychiatrist       Other Visit Diagnoses    Deviated septum    -  Primary   refer to ENT, as her deviated septum may be contributing to her headaches   Relevant Orders   Ambulatory referral to ENT   Chronic nonintractable headache, unspecified headache type       started on aimovig earlier and improving; will start Emgality, loading dose 240 then 120 every month; pt to return for first injection in the next 1-2 weeks   Relevant Orders   Ambulatory referral to ENT       Follow up plan: No follow-ups on file.  An after-visit summary was printed and given to the  patient at check-out.  Please see the patient instructions which may contain other information and recommendations beyond what  is mentioned above in the assessment and plan.  No orders of the defined types were placed in this encounter.   Orders Placed This Encounter  Procedures  . Ambulatory referral to ENT

## 2018-01-15 NOTE — Assessment & Plan Note (Signed)
Managed by psychiatrist

## 2018-01-28 ENCOUNTER — Encounter: Payer: BC Managed Care – PPO | Admitting: Family Medicine

## 2018-01-30 ENCOUNTER — Other Ambulatory Visit: Payer: Self-pay | Admitting: Otolaryngology

## 2018-01-30 ENCOUNTER — Encounter: Payer: BC Managed Care – PPO | Admitting: Nurse Practitioner

## 2018-01-30 DIAGNOSIS — R519 Headache, unspecified: Secondary | ICD-10-CM

## 2018-01-30 DIAGNOSIS — R51 Headache: Principal | ICD-10-CM

## 2018-02-05 ENCOUNTER — Telehealth: Payer: Self-pay

## 2018-02-05 NOTE — Telephone Encounter (Signed)
Called patient in regards to her needing Emgality. She has been evaluated by ENT and no longer needs this medication she thinks.

## 2018-02-05 NOTE — Telephone Encounter (Signed)
That is great news I have removed Emgality from her med list and she won't have to take this

## 2018-02-12 ENCOUNTER — Ambulatory Visit: Payer: Self-pay

## 2018-02-21 ENCOUNTER — Ambulatory Visit: Payer: BC Managed Care – PPO

## 2018-02-26 ENCOUNTER — Telehealth: Payer: Self-pay | Admitting: Family Medicine

## 2018-02-26 NOTE — Telephone Encounter (Signed)
Received denial for [facility]ig from [facility]  Review of last notes from Dr. Sherie DonLada - Aimovig and Emgality have been removed from the active medication list.

## 2018-05-08 ENCOUNTER — Encounter: Payer: Self-pay | Admitting: Nurse Practitioner

## 2018-05-08 ENCOUNTER — Telehealth: Payer: Self-pay | Admitting: Family Medicine

## 2018-05-08 ENCOUNTER — Other Ambulatory Visit: Payer: Self-pay | Admitting: Nurse Practitioner

## 2018-05-08 ENCOUNTER — Ambulatory Visit: Payer: BC Managed Care – PPO | Admitting: Nurse Practitioner

## 2018-05-08 VITALS — BP 120/70 | HR 102 | Temp 98.3°F | Resp 12 | Ht 59.0 in | Wt 105.7 lb

## 2018-05-08 DIAGNOSIS — R110 Nausea: Secondary | ICD-10-CM

## 2018-05-08 DIAGNOSIS — R10823 Right lower quadrant rebound abdominal tenderness: Secondary | ICD-10-CM | POA: Diagnosis not present

## 2018-05-08 DIAGNOSIS — J029 Acute pharyngitis, unspecified: Secondary | ICD-10-CM | POA: Diagnosis not present

## 2018-05-08 MED ORDER — MAGIC MOUTHWASH W/LIDOCAINE: 5.0000 mL | Freq: Three times a day (TID) | ORAL | 0 refills | Status: DC | PRN

## 2018-05-08 MED ORDER — PHENOL-GLYCERIN 1.5-33 % MT LIQD: OROMUCOSAL | 0 refills | Status: DC

## 2018-05-08 MED ORDER — ONDANSETRON HCL 4 MG PO TABS: 4.0000 mg | ORAL_TABLET | Freq: Three times a day (TID) | ORAL | 0 refills | Status: DC | PRN

## 2018-05-08 MED ORDER — PROMETHAZINE HCL 12.5 MG PO TABS: 12.5000 mg | ORAL_TABLET | Freq: Three times a day (TID) | ORAL | 0 refills | Status: DC | PRN

## 2018-05-08 NOTE — Telephone Encounter (Signed)
Copied from CRM [id number]. Topic: Quick Communication - See Telephone Encounter >> May 08, 2018  1:51 PM Jens SomMedley, [name]: CRM for notification. See Telephone encounter for: 05/08/18.  Patient is calling regarding the magic mouthwash w/lidocaine SOLN [[id number]][[id number]]   Patient states that it is not covered by insurance Patient is requesting another script for something that is covered by her insurance. Also patient is requesting a less expensive option for ondansetron (ZOFRAN) 4 MG tablet [[id number]][[id number]] Patient states that this medication is $76. Please advise [phone]

## 2018-05-08 NOTE — Patient Instructions (Addendum)
-  Please call to schedule a follow-up with GI; 6 month follow-up was recommended on 05/05/2017 Melodie BouillonVarnita Tahiliani, MD [address], [address], [place], [place], [id number] [address], [address], [place], [place], [address] Phone: [phone]  Fax: [phone]

## 2018-05-08 NOTE — Progress Notes (Signed)
Name: Elizabeth Velez   MRN: [id number]    DOB: 08/06/1995   Date:05/08/2018       Progress Note  Subjective  Chief Complaint  Chief Complaint  Patient presents with  . Sore Throat  . Emesis  . Abdominal Pain    HPI  Patient states Wednesday night had sore throat, woke up with hoarse voice, then had nausea with dry heaving, cough, decreased appetite, fatigue, mild body aches, 3/10 generalized abdominal pain-constant, no changes in bowel movements. Tried to eat some crackers this morning and has kept it down but caused some stomach pain.   Denies fevers, chills, shortness of breath, chest pain.  Patient Active Problem List   Diagnosis Date Noted  . IBS (irritable bowel syndrome) 09/26/2017  . Preventative health care 12/02/2016  . History of rectal bleeding 09/06/2016  . Abnormal loss of weight 07/15/2016  . Muscle cramps 02/12/2016  . Joint crepitus 12/29/2015  . Raynaud phenomenon 12/29/2015  . Unexplained night sweats 12/29/2015  . Heart murmur previously undiagnosed 03/29/2015  . Oral contraceptive pill surveillance 03/29/2015  . 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 Major depressive disorder, recurrent, in remission (HCC) 08/02/2014    Past Medical History:  Diagnosis Date  . Anxiety   . Bipolar 1 disorder, depressed (HCC)   . Depression   . Depression 08/02/2014  . Excessive daytime sleepiness 08/02/2014  . 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 Migraine     Past Surgical History:  Procedure Laterality Date  . WISDOM TOOTH EXTRACTION Bilateral 11/22/2016    Social History   Tobacco Use  . Smoking status: Never Smoker  . Smokeless tobacco: Never Used  Substance Use Topics  . Alcohol use: No    Alcohol/week: 0.0 standard drinks    Frequency: Never    Comment: only once a month     Current Outpatient Medications:  .  fluticasone (FLONASE) 50 MCG/ACT nasal spray, Place 2 sprays into both nostrils daily., Disp:  16 g, Rfl: 6 .  lamoTRIgine (LAMICTAL) 200 MG tablet, Take 1 tablet (200 mg total) by mouth daily., Disp: 90 tablet, Rfl: 0 .  levonorgestrel-ethinyl estradiol (LARISSIA) 0.1-20 MG-MCG tablet, Take 1 tablet by mouth daily., Disp: , Rfl:  .  Melatonin 3 MG TABS, Take by mouth as needed. , Disp: , Rfl:  .  sertraline (ZOLOFT) 25 MG tablet, , Disp: , Rfl:  .  butalbital-acetaminophen-caffeine (FIORICET, ESGIC) 50-325-40 MG tablet, Take 1 tablet by mouth every 6 (six) hours as needed for headache or migraine. (Patient not taking: Reported on 05/08/2018), Disp: 20 tablet, Rfl: 0 .  LORazepam (ATIVAN) 0.5 MG tablet, Take by mouth., Disp: , Rfl:   Allergies  Allergen Reactions  . Penicillins   . Zithromax [Azithromycin]     ROS   No other specific complaints in a complete review of systems (except as listed in HPI above).  Objective  Vitals:   05/08/18 1146  BP: 120/70  Pulse: (!) 102  Resp: 12  Temp: 98.3 F (36.8 C)  TempSrc: Oral  SpO2: 98%  Weight: 105 lb 11.2 oz (47.9 kg)  Height: 4\' 11"  (1.499 m)    Body mass index is 21.35 kg/m.  Nursing Note and Vital Signs reviewed.  Physical Exam  Constitutional: She appears well-developed and well-nourished.  Non-toxic appearance. She does not appear ill.  HENT:  Head: Normocephalic and atraumatic.  Mouth/Throat: Uvula is midline. Mucous membranes are dry. Posterior oropharyngeal erythema present. No oropharyngeal exudate  or posterior oropharyngeal edema. No tonsillar exudate.  Eyes: Pupils are equal, round, and reactive to light.  Neck: Normal range of motion. Neck supple.  Cardiovascular: Regular rhythm and intact distal pulses.  Pulmonary/Chest: Effort normal and breath sounds normal.  Abdominal: Soft. Bowel sounds are increased. There is tenderness in the right lower quadrant.  Lymphadenopathy:    She has no cervical adenopathy.      No results found for this or any previous visit (from the past 48  hour(s)).  Assessment & Plan  1. Right lower quadrant abdominal tenderness with rebound tenderness Discussed red flats, and ER precautions,  - CBC with Differential - COMPLETE METABOLIC PANEL WITH GFR  2. Sore throat - magic mouthwash w/lidocaine SOLN; Take 5 mLs by mouth 3 (three) times daily as needed for mouth pain.  Dispense: 50 mL; Refill: 0  3. Nausea - ondansetron (ZOFRAN) 4 MG tablet; Take 1 tablet (4 mg total) by mouth every 8 (eight) hours as needed for nausea or vomiting.  Dispense: 20 tablet; Refill: 0  Discussed likely viral etiology and OTC management and importance of hydration. Recommended GI follow-up and provided number for follow-up of chronic GI problems.   -Red flags and when to present for emergency care or RTC including fever >101.48F,  new/worsening/un-resolving symptoms,  reviewed with patient at time of visit. Follow up and care instructions discussed and provided in AVS.

## 2018-05-08 NOTE — Telephone Encounter (Signed)
Sent promethazine to [facility]- please inform her can cause sedative effects do not take with lorazepam. Sent cholraseptic throat spray; can also discuss with pharmacist OTC options for sore throat.

## 2018-05-09 LAB — CBC WITH DIFFERENTIAL/PLATELET
BASOS PCT: 0.4 %
Basophils Absolute: 28 cells/uL (ref 0–200)
Eosinophils Absolute: 42 cells/uL (ref 15–500)
Eosinophils Relative: 0.6 %
HEMATOCRIT: 40.3 % (ref 35.0–45.0)
Hemoglobin: 14.2 g/dL (ref 11.7–15.5)
LYMPHS ABS: 1673 {cells}/uL (ref 850–3900)
MCH: 33.6 pg — ABNORMAL HIGH (ref 27.0–33.0)
MCHC: 35.2 g/dL (ref 32.0–36.0)
MCV: 95.5 fL (ref 80.0–100.0)
MPV: 9.1 fL (ref 7.5–12.5)
Monocytes Relative: 5.9 %
NEUTROS PCT: 69.2 %
Neutro Abs: 4844 cells/uL (ref 1500–7800)
Platelets: 245 10*3/uL (ref 140–400)
RBC: 4.22 10*6/uL (ref 3.80–5.10)
RDW: 11.7 % (ref 11.0–15.0)
Total Lymphocyte: 23.9 %
WBC: 7 10*3/uL (ref 3.8–10.8)
WBCMIX: 413 {cells}/uL (ref 200–950)

## 2018-05-09 LAB — COMPLETE METABOLIC PANEL WITH GFR
AG RATIO: 1.9 (calc) (ref 1.0–2.5)
ALBUMIN MSPROF: 4 g/dL (ref 3.6–5.1)
ALT: 7 U/L (ref 6–29)
AST: 17 U/L (ref 10–30)
Alkaline phosphatase (APISO): 36 U/L (ref 33–115)
BUN: 12 mg/dL (ref 7–25)
CO2: 28 mmol/L (ref 20–32)
Calcium: 8.8 mg/dL (ref 8.6–10.2)
Chloride: 103 mmol/L (ref 98–110)
Creat: 0.84 mg/dL (ref 0.50–1.10)
GFR, EST AFRICAN AMERICAN: 113 mL/min/{1.73_m2} (ref >=60)
GFR, Est Non African American: 97 mL/min/{1.73_m2} (ref >=60)
GLOBULIN: 2.1 g/dL (ref 1.9–3.7)
Glucose, Bld: 91 mg/dL (ref 65–139)
POTASSIUM: 4 mmol/L (ref 3.5–5.3)
SODIUM: 137 mmol/L (ref 135–146)
TOTAL PROTEIN: 6.1 g/dL (ref 6.1–8.1)
Total Bilirubin: 0.7 mg/dL (ref 0.2–1.2)

## 2018-05-11 NOTE — Telephone Encounter (Signed)
Called patient. Left detailed messaged in regards to new prescription sent to pharmacy and the precautions to take.

## 2018-05-13 ENCOUNTER — Ambulatory Visit
Admission: RE | Admit: 2018-05-13 | Discharge: 2018-05-13 | Disposition: A | Discharge status: Home / Self Care | Payer: BC Managed Care – PPO | Source: Ambulatory Visit | Attending: Family Medicine | Admitting: Family Medicine

## 2018-05-13 ENCOUNTER — Encounter: Payer: Self-pay | Admitting: Family Medicine

## 2018-05-13 ENCOUNTER — Other Ambulatory Visit
Admission: RE | Admit: 2018-05-13 | Discharge: 2018-05-13 | Disposition: A | Discharge status: Home / Self Care | Payer: BC Managed Care – PPO | Source: Ambulatory Visit | Attending: Family Medicine | Admitting: Family Medicine

## 2018-05-13 ENCOUNTER — Ambulatory Visit: Payer: BC Managed Care – PPO | Admitting: Family Medicine

## 2018-05-13 VITALS — BP 108/70 | HR 116 | Temp 98.8°F | Ht 59.0 in | Wt 104.6 lb

## 2018-05-13 DIAGNOSIS — R1031 Right lower quadrant pain: Secondary | ICD-10-CM

## 2018-05-13 DIAGNOSIS — J029 Acute pharyngitis, unspecified: Secondary | ICD-10-CM

## 2018-05-13 DIAGNOSIS — Z0000 Encounter for general adult medical examination without abnormal findings: Secondary | ICD-10-CM | POA: Diagnosis not present

## 2018-05-13 DIAGNOSIS — K439 Ventral hernia without obstruction or gangrene: Secondary | ICD-10-CM

## 2018-05-13 HISTORY — DX: Ventral hernia without obstruction or gangrene: K43.9

## 2018-05-13 LAB — CBC WITH DIFFERENTIAL/PLATELET
ABS IMMATURE GRANULOCYTES: 0.02 10*3/uL (ref 0.00–0.07)
BASOS ABS: 0 10*3/uL (ref 0.0–0.1)
Basophils Relative: 0 %
Eosinophils Absolute: 0.1 10*3/uL (ref 0.0–0.5)
Eosinophils Relative: 2 %
HCT: 42.2 % (ref 36.0–46.0)
HEMOGLOBIN: 14.4 g/dL (ref 12.0–15.0)
IMMATURE GRANULOCYTES: 0 %
LYMPHS PCT: 13 %
Lymphs Abs: 0.9 10*3/uL (ref 0.7–4.0)
MCH: 32.8 pg (ref 26.0–34.0)
MCHC: 34.1 g/dL (ref 30.0–36.0)
MCV: 96.1 fL (ref 80.0–100.0)
MONO ABS: 0.4 10*3/uL (ref 0.1–1.0)
Monocytes Relative: 6 %
NEUTROS ABS: 5.8 10*3/uL (ref 1.7–7.7)
NEUTROS PCT: 79 %
Platelets: 226 10*3/uL (ref 150–400)
RBC: 4.39 MIL/uL (ref 3.87–5.11)
RDW: 12.1 % (ref 11.5–15.5)
WBC: 7.3 10*3/uL (ref 4.0–10.5)
nRBC: 0 % (ref 0.0–0.2)

## 2018-05-13 LAB — COMPREHENSIVE METABOLIC PANEL
ALBUMIN: 3.8 g/dL (ref 3.5–5.0)
ALK PHOS: 38 U/L (ref 38–126)
ALT: 13 U/L (ref 0–44)
AST: 19 U/L (ref 15–41)
Anion gap: 10 (ref 5–15)
BUN: 11 mg/dL (ref 6–20)
CALCIUM: 8.8 mg/dL — AB (ref 8.9–10.3)
CHLORIDE: 102 mmol/L (ref 98–111)
CO2: 26 mmol/L (ref 22–32)
CREATININE: 0.73 mg/dL (ref 0.44–1.00)
GFR calc Af Amer: >60 mL/min (ref >60)
GFR calc non Af Amer: >60 mL/min (ref >60)
GLUCOSE: 98 mg/dL (ref 70–99)
Potassium: 4 mmol/L (ref 3.5–5.1)
SODIUM: 138 mmol/L (ref 135–145)
Total Bilirubin: 0.8 mg/dL (ref 0.3–1.2)
Total Protein: 7.1 g/dL (ref 6.5–8.1)

## 2018-05-13 LAB — POCT RAPID STREP A (OFFICE): Rapid Strep A Screen: NEGATIVE

## 2018-05-13 LAB — GROUP A STREP BY PCR: Group A Strep by PCR: NOT DETECTED

## 2018-05-13 MED ORDER — IOPAMIDOL (ISOVUE-300) INJECTION 61%
75.0000 mL | Freq: Once | INTRAVENOUS | Status: AC | PRN
  Administered 2018-05-13: 75 mL via INTRAVENOUS

## 2018-05-13 NOTE — Assessment & Plan Note (Signed)
USPSTF grade A and B recommendations reviewed with patient; age-appropriate recommendations, preventive care, screening tests, etc discussed and encouraged; healthy living encouraged; see AVS for patient education given to patient  

## 2018-05-13 NOTE — Progress Notes (Signed)
Patient ID: Elizabeth Velez, female   DOB: 1995/06/20, 22 y.o.   MRN: [id number]   Subjective:   Elizabeth Velez is a 22 y.o. female here for a complete physical exam  She is having RLQ pain; going on for months; seeing GI; having liquid stool; saw NP last week; had it pretty bad at work last year; saw GI at [facility] and then had pelvic US; pain no worse since last week's evaluation  Also has a cold; had hoarse voice, cough, decreased appetite; mild body aches; she had sore throat; people at work said strep, but she was not exposed to strep to her knowledge; her throat was "pretty bad" for the first day or so; she was allergic to penicillin at age 59 or 3, just had hives; allergic to zithromycin (lips swelled, around age 61); no fevers  USPSTF grade A and B recommendations Depression:  Depression screen [facility] 2/9 05/13/2018 05/08/2018 01/15/2018 05/29/2017 11/29/2016  Decreased Interest 1 0 0 0 0  Down, Depressed, Hopeless 1 0 1 1 0  PHQ - 2 Score 2 0 1 1 0  Altered sleeping 1 1 0 - -  Tired, decreased energy 3 1 0 - -  Change in appetite 3 3 0 - -  Feeling bad or failure about yourself  0 0 0 - -  Trouble concentrating 0 0 0 - -  Moving slowly or fidgety/restless 0 0 0 - -  Suicidal thoughts 0 0 0 - -  PHQ-9 Score '9 5 1 '$ - -  Difficult doing work/chores Not difficult at all - Not difficult at all - -   Hypertension: BP Readings from Last 3 Encounters:  05/13/18 108/70  05/08/18 120/70  01/15/18 110/68   Obesity: Wt Readings from Last 3 Encounters:  05/13/18 104 lb 9.6 oz (47.4 kg)  05/08/18 105 lb 11.2 oz (47.9 kg)  01/15/18 106 lb 8 oz (48.3 kg)   BMI Readings from Last 3 Encounters:  05/13/18 21.13 kg/m  05/08/18 21.35 kg/m  01/15/18 21.51 kg/m    Skin cancer: has a mole on her back; it has not grown or changed; brown nevus upper back, benign Lung cancer:  Nonsmoker, no real passive exposure Breast cancer: through GYN; no lumps or bumps Colorectal cancer: maternal  grandfather may have had colon cancer; she will check to get more info Cervical cancer screening: through GYN BRCA gene screening: family hx of breast and/or ovarian cancer and/or metastatic prostate cancer? no HIV, hep B, hep C: through GYN STD testing and prevention (chl/gon/syphilis): through GYN Intimate partner violence: no abuse Contraception: on larrissia Osteoporosis: thin white woman Fall prevention/vitamin D: discussed; dark green leafy veggies, milk; tries to be outside 30 minutes a day Immunizations: two of the three HPV vaccines Diet: still has GI issues, following up with GI doctor next week; not eating a whole lot; has to be careful with foods, going to the bathroom Exercise: not getting 150 minutes per week; 6k steps a day Alcohol:    Office Visit from 05/13/2018 in [facility]  AUDIT-C Score  1     Tobacco use: never AAA: n/a Aspirin: n/a Glucose:  Glucose  Date Value Ref Range Status  10/10/2013 91 65 - 99 mg/dL Final   Glucose, Bld  Date Value Ref Range Status  05/08/2018 91 65 - 139 mg/dL Final    Comment:    .        Non-fasting reference interval .   09/26/2017 86 65 -  99 mg/dL Final    Comment:    .            Fasting reference interval .   04/23/2017 95 65 - 99 mg/dL Final   Lipids:  Lab Results  Component Value Date   CHOL 147 03/29/2015   Lab Results  Component Value Date   HDL 55 03/29/2015   Lab Results  Component Value Date   LDLCALC 80 03/29/2015   Lab Results  Component Value Date   TRIG 61 03/29/2015   Lab Results  Component Value Date   CHOLHDL 2.7 03/29/2015   No results found for: LDLDIRECT   Past Medical History:  Diagnosis Date  . Anxiety   . Bipolar 1 disorder, depressed ([place])   . Depression   . Depression 08/02/2014  . Excessive daytime sleepiness 08/02/2014  . 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 Migraine    Past Surgical History:  Procedure Laterality Date  .  WISDOM TOOTH EXTRACTION Bilateral 11/22/2016   Family History  Problem Relation Age of Onset  . Alzheimer's disease Maternal Grandmother   . Heart disease Maternal Grandfather   . Alcohol abuse Paternal Aunt   . Depression Maternal Aunt    Social History   Tobacco Use  . Smoking status: Never Smoker  . Smokeless tobacco: Never Used  Substance Use Topics  . Alcohol use: No    Alcohol/week: 0.0 standard drinks    Frequency: Never    Comment: only once a month  . Drug use: No   Review of Systems  Objective:   Vitals:   05/13/18 0808  BP: 108/70  Pulse: (!) 116  Temp: 98.8 F (37.1 C)  SpO2: 99%  Weight: 104 lb 9.6 oz (47.4 kg)  Height: '4\' 11"'$  (1.499 m)   Body mass index is 21.13 kg/m. Wt Readings from Last 3 Encounters:  05/13/18 104 lb 9.6 oz (47.4 kg)  05/08/18 105 lb 11.2 oz (47.9 kg)  01/15/18 106 lb 8 oz (48.3 kg)   Physical Exam  Constitutional: She appears well-developed and well-nourished.  HENT:  Right Ear: Hearing, tympanic membrane, external ear and ear canal normal.  Left Ear: Hearing, tympanic membrane, external ear and ear canal normal.  Nose: Rhinorrhea present.  Mouth/Throat: Posterior oropharyngeal erythema (mild posterior injection) present. No posterior oropharyngeal edema.  Eyes: Conjunctivae and EOM are normal. Right eye exhibits no hordeolum. Left eye exhibits no hordeolum. No scleral icterus.  Neck: Carotid bruit is not present. No thyromegaly present.  Cardiovascular: Regular rhythm, S1 normal, S2 normal and normal heart sounds.  No extrasystoles are present. Tachycardia present.  Pulmonary/Chest: Effort normal and breath sounds normal. No respiratory distress.  Abdominal: Soft. Normal appearance and bowel sounds are normal. She exhibits no distension, no abdominal bruit, no pulsatile midline mass and no mass. There is no hepatosplenomegaly. There is tenderness in the right lower quadrant. There is no rebound and no guarding. No hernia.   Musculoskeletal: Normal range of motion. She exhibits no edema.  Lymphadenopathy:       Head (right side): No submandibular adenopathy present.       Head (left side): No submandibular adenopathy present.    She has no cervical adenopathy.  Neurological: She is alert. She displays no tremor. She exhibits normal muscle tone. Gait normal.  Reflex Scores:      Patellar reflexes are 2+ on the right side and 2+ on the left side. Skin: Skin is warm and dry. No bruising and no  ecchymosis noted. No cyanosis. No pallor.  Psychiatric: Her speech is normal and behavior is normal. Thought content normal. Her mood appears not anxious. She does not exhibit a depressed mood.    Assessment/Plan:   Problem List Items Addressed This Visit      Other   Preventative health care - Primary    USPSTF grade A and B recommendations reviewed with patient; age-appropriate recommendations, preventive care, screening tests, etc discussed and encouraged; healthy living encouraged; see AVS for patient education given to patient        Other Visit Diagnoses    Pharyngitis, unspecified etiology       will get throat culture given her tachycardia and nausea   Relevant Orders   Culture, Group A Strep   POCT rapid strep A (Completed)   Right lower quadrant abdominal pain       reviewed [place] from last year; will get stat CT scan; reviewed last CBC; ordering CBC and CMP stat at the hospital; to ER if worse; r/o appendicitis   Relevant Orders   CT Abdomen Pelvis W Contrast   Comprehensive metabolic panel   CBC with Differential/Platelet       No orders of the defined types were placed in this encounter.  Orders Placed This Encounter  Procedures  . Culture, Group A Strep    Order Specific Question:   Source    Answer:   oropharynx  . CT Abdomen Pelvis W Contrast    Patient to wait until report called    Standing Status:   Future    Standing Expiration Date:   06/12/2018    Order Specific Question:   **  REASON FOR EXAM (FREE TEXT)    Answer:   RLQ pain    Order Specific Question:   If indicated for the ordered procedure, I authorize the administration of contrast media per Radiology protocol    Answer:   Yes    Order Specific Question:   Is patient pregnant?    Answer:   No    Order Specific Question:   Preferred imaging location?    Answer:   [facility]    Order Specific Question:   Is Oral Contrast requested for this exam?    Answer:   Yes, Per Radiology protocol    Order Specific Question:   Call Results- Best Contact Number?    Answer:   [phone]    Order Specific Question:   Radiology Contrast Protocol - do NOT remove file path    Answer:   [email or web address]  . Comprehensive metabolic panel    Standing Status:   Future    Standing Expiration Date:   05/16/2018  . CBC with Differential/Platelet    Standing Status:   Future    Standing Expiration Date:   05/16/2018  . POCT rapid strep A    Follow up plan: Return in about 1 year (around 05/14/2019) for complete physical.  An After Visit Summary was printed and given to the patient.

## 2018-05-13 NOTE — Patient Instructions (Addendum)
We'll culture your throat and will call you at [phone] if positive Let's get a CT scan today Keep your follow-up with Dr. Bonna Gains Go to the ER if pain gets significant  Health Maintenance, Female Adopting a healthy lifestyle and getting preventive care can go a long way to promote health and wellness. Talk with your health care provider about what schedule of regular examinations is right for you. This is a good chance for you to check in with your provider about disease prevention and staying healthy. In between checkups, there are plenty of things you can do on your own. Experts have done a lot of research about which lifestyle changes and preventive measures are most likely to keep you healthy. Ask your health care provider for more information. Weight and diet Eat a healthy diet  Be sure to include plenty of vegetables, fruits, low-fat dairy products, and lean protein.  Do not eat a lot of foods high in solid fats, added sugars, or salt.  Get regular exercise. This is one of the most important things you can do for your health. ? Most adults should exercise for at least 150 minutes each week. The exercise should increase your heart rate and make you sweat (moderate-intensity exercise). ? Most adults should also do strengthening exercises at least twice a week. This is in addition to the moderate-intensity exercise.  Maintain a healthy weight  Body mass index (BMI) is a measurement that can be used to identify possible weight problems. It estimates body fat based on height and weight. Your health care provider can help determine your BMI and help you achieve or maintain a healthy weight.  For females 38 years of age and older: ? A BMI below 18.5 is considered underweight. ? A BMI of 18.5 to 24.9 is normal. ? A BMI of 25 to 29.9 is considered overweight. ? A BMI of 30 and above is considered obese.  Watch levels of cholesterol and blood lipids  You should start having your  blood tested for lipids and cholesterol at 24 years of age, then have this test every 5 years.  You may need to have your cholesterol levels checked more often if: ? Your lipid or cholesterol levels are high. ? You are older than 24 years of age. ? You are at high risk for heart disease.  Cancer screening Lung Cancer  Lung cancer screening is recommended for adults 64-75 years old who are at high risk for lung cancer because of a history of smoking.  A yearly low-dose CT scan of the lungs is recommended for people who: ? Currently smoke. ? Have quit within the past 15 years. ? Have at least a 30-pack-year history of smoking. A pack year is smoking an average of one pack of cigarettes a day for 1 year.  Yearly screening should continue until it has been 15 years since you quit.  Yearly screening should stop if you develop a health problem that would prevent you from having lung cancer treatment.  Breast Cancer  Practice breast self-awareness. This means understanding how your breasts normally appear and feel.  It also means doing regular breast self-exams. Let your health care provider know about any changes, no matter how small.  If you are in your 20s or 30s, you should have a clinical breast exam (CBE) by a health care provider every 1-3 years as part of a regular health exam.  If you are 63 or older, have a CBE every year. Also consider  having a breast X-ray (mammogram) every year.  If you have a family history of breast cancer, talk to your health care provider about genetic screening.  If you are at high risk for breast cancer, talk to your health care provider about having an MRI and a mammogram every year.  Breast cancer gene (BRCA) assessment is recommended for women who have family members with BRCA-related cancers. BRCA-related cancers include: ? Breast. ? Ovarian. ? Tubal. ? Peritoneal cancers.  Results of the assessment will determine the need for genetic  counseling and BRCA1 and BRCA2 testing.  Cervical Cancer Your health care provider may recommend that you be screened regularly for cancer of the pelvic organs (ovaries, uterus, and vagina). This screening involves a pelvic examination, including checking for microscopic changes to the surface of your cervix (Pap test). You may be encouraged to have this screening done every 3 years, beginning at age 91.  For women ages 52-65, health care providers may recommend pelvic exams and Pap testing every 3 years, or they may recommend the Pap and pelvic exam, combined with testing for human papilloma virus (HPV), every 5 years. Some types of HPV increase your risk of cervical cancer. Testing for HPV may also be done on women of any age with unclear Pap test results.  Other health care providers may not recommend any screening for nonpregnant women who are considered low risk for pelvic cancer and who do not have symptoms. Ask your health care provider if a screening pelvic exam is right for you.  If you have had past treatment for cervical cancer or a condition that could lead to cancer, you need Pap tests and screening for cancer for at least 20 years after your treatment. If Pap tests have been discontinued, your risk factors (such as having a new sexual partner) need to be reassessed to determine if screening should resume. Some women have medical problems that increase the chance of getting cervical cancer. In these cases, your health care provider may recommend more frequent screening and Pap tests.  Colorectal Cancer  This type of cancer can be detected and often prevented.  Routine colorectal cancer screening usually begins at 24 years of age and continues through 24 years of age.  Your health care provider may recommend screening at an earlier age if you have risk factors for colon cancer.  Your health care provider may also recommend using home test kits to check for hidden blood in the  stool.  A small camera at the end of a tube can be used to examine your colon directly (sigmoidoscopy or colonoscopy). This is done to check for the earliest forms of colorectal cancer.  Routine screening usually begins at age 21.  Direct examination of the colon should be repeated every 5-10 years through 24 years of age. However, you may need to be screened more often if early forms of precancerous polyps or small growths are found.  Skin Cancer  Check your skin from head to toe regularly.  Tell your health care provider about any new moles or changes in moles, especially if there is a change in a mole's shape or color.  Also tell your health care provider if you have a mole that is larger than the size of a pencil eraser.  Always use sunscreen. Apply sunscreen liberally and repeatedly throughout the day.  Protect yourself by wearing long sleeves, pants, a wide-brimmed hat, and sunglasses whenever you are outside.  Heart disease, diabetes, and high blood pressure  High blood pressure causes heart disease and increases the risk of stroke. High blood pressure is more likely to develop in: ? People who have blood pressure in the high end of the normal range (130-139/85-89 mm Hg). ? People who are overweight or obese. ? People who are African American.  If you are 14-51 years of age, have your blood pressure checked every 3-5 years. If you are 50 years of age or older, have your blood pressure checked every year. You should have your blood pressure measured twice-once when you are at a hospital or clinic, and once when you are not at a hospital or clinic. Record the average of the two measurements. To check your blood pressure when you are not at a hospital or clinic, you can use: ? An automated blood pressure machine at a pharmacy. ? A home blood pressure monitor.  If you are between 6 years and 19 years old, ask your health care provider if you should take aspirin to prevent  strokes.  Have regular diabetes screenings. This involves taking a blood sample to check your fasting blood sugar level. ? If you are at a normal weight and have a low risk for diabetes, have this test once every three years after 24 years of age. ? If you are overweight and have a high risk for diabetes, consider being tested at a younger age or more often. Preventing infection Hepatitis B  If you have a higher risk for hepatitis B, you should be screened for this virus. You are considered at high risk for hepatitis B if: ? You were born in a country where hepatitis B is common. Ask your health care provider which countries are considered high risk. ? Your parents were born in a high-risk country, and you have not been immunized against hepatitis B (hepatitis B vaccine). ? You have HIV or AIDS. ? You use needles to inject street drugs. ? You live with someone who has hepatitis B. ? You have had sex with someone who has hepatitis B. ? You get hemodialysis treatment. ? You take certain medicines for conditions, including cancer, organ transplantation, and autoimmune conditions.  Hepatitis C  Blood testing is recommended for: ? Everyone born from [birth year] through 1967. ? Anyone with known risk factors for hepatitis C.  Sexually transmitted infections (STIs)  You should be screened for sexually transmitted infections (STIs) including gonorrhea and chlamydia if: ? You are sexually active and are younger than 24 years of age. ? You are older than 24 years of age and your health care provider tells you that you are at risk for this type of infection. ? Your sexual activity has changed since you were last screened and you are at an increased risk for chlamydia or gonorrhea. Ask your health care provider if you are at risk.  If you do not have HIV, but are at risk, it may be recommended that you take a prescription medicine daily to prevent HIV infection. This is called pre-exposure prophylaxis  (PrEP). You are considered at risk if: ? You are sexually active and do not regularly use condoms or know the HIV status of your partner(s). ? You take drugs by injection. ? You are sexually active with a partner who has HIV.  Talk with your health care provider about whether you are at high risk of being infected with HIV. If you choose to begin PrEP, you should first be tested for HIV. You should then be tested every 3 months  for as long as you are taking PrEP. Pregnancy  If you are premenopausal and you may become pregnant, ask your health care provider about preconception counseling.  If you may become pregnant, take 400 to 800 micrograms (mcg) of folic acid every day.  If you want to prevent pregnancy, talk to your health care provider about birth control (contraception). Osteoporosis and menopause  Osteoporosis is a disease in which the bones lose minerals and strength with aging. This can result in serious bone fractures. Your risk for osteoporosis can be identified using a bone density scan.  If you are 8 years of age or older, or if you are at risk for osteoporosis and fractures, ask your health care provider if you should be screened.  Ask your health care provider whether you should take a calcium or vitamin D supplement to lower your risk for osteoporosis.  Menopause may have certain physical symptoms and risks.  Hormone replacement therapy may reduce some of these symptoms and risks. Talk to your health care provider about whether hormone replacement therapy is right for you. Follow these instructions at home:  Schedule regular health, dental, and eye exams.  Stay current with your immunizations.  Do not use any tobacco products including cigarettes, chewing tobacco, or electronic cigarettes.  If you are pregnant, do not drink alcohol.  If you are breastfeeding, limit how much and how often you drink alcohol.  Limit alcohol intake to no more than 1 drink per day for  nonpregnant women. One drink equals 12 ounces of beer, 5 ounces of wine, or 1 ounces of hard liquor.  Do not use street drugs.  Do not share needles.  Ask your health care provider for help if you need support or information about quitting drugs.  Tell your health care provider if you often feel depressed.  Tell your health care provider if you have ever been abused or do not feel safe at home. This information is not intended to replace advice given to you by your health care provider. Make sure you discuss any questions you have with your health care provider. Document Released: 12/17/2010 Document Revised: 11/09/2015 Document Reviewed: 03/07/2015 Elsevier Interactive Patient Education  [employer].

## 2018-05-15 LAB — CULTURE, GROUP A STREP
MICRO NUMBER:: 91431939
SPECIMEN QUALITY:: ADEQUATE

## 2018-05-21 ENCOUNTER — Encounter: Payer: Self-pay | Admitting: Gastroenterology

## 2018-05-21 ENCOUNTER — Ambulatory Visit: Payer: BC Managed Care – PPO | Admitting: Gastroenterology

## 2018-05-21 VITALS — BP 119/74 | HR 94 | Ht 59.0 in | Wt 110.4 lb

## 2018-05-21 DIAGNOSIS — K580 Irritable bowel syndrome with diarrhea: Secondary | ICD-10-CM

## 2018-05-21 NOTE — Patient Instructions (Signed)
Irritable Bowel Syndrome, Adult Irritable bowel syndrome (IBS) is not one specific disease. It is a group of symptoms that affects the organs responsible for digestion (gastrointestinal or GI tract). To regulate how your GI tract works, your body sends signals back and forth between your intestines and your brain. If you have IBS, there may be a problem with these signals. As a result, your GI tract does not function normally. Your intestines may become more sensitive and overreact to certain things. This is especially true when you eat certain foods or when you are under stress. There are four types of IBS. These may be determined based on the consistency of your stool:  IBS with diarrhea.  IBS with constipation.  Mixed IBS.  Unsubtyped IBS.  It is important to know which type of IBS you have. Some treatments are more likely to be helpful for certain types of IBS. What are the causes? The exact cause of IBS is not known. What increases the risk? You may have a higher risk of IBS if:  You are a woman.  You are younger than 24 years old.  You have a family history of IBS.  You have mental health problems.  You have had bacterial infection of your GI tract.  What are the signs or symptoms? Symptoms of IBS vary from person to person. The main symptom is abdominal pain or discomfort. Additional symptoms usually include one or more of the following:  Diarrhea, constipation, or both.  Abdominal swelling or bloating.  Feeling full or sick after eating a small or regular-size meal.  Frequent gas.  Mucus in the stool.  A feeling of having more stool left after a bowel movement.  Symptoms tend to come and go. They may be associated with stress, psychiatric conditions, or nothing at all. How is this diagnosed? There is no specific test to diagnose IBS. Your health care provider will make a diagnosis based on a physical exam, medical history, and your symptoms. You may have other  tests to rule out other conditions that may be causing your symptoms. These may include:  Blood tests.  X-rays.  CT scan.  Endoscopy and colonoscopy. This is a test in which your GI tract is viewed with a long, thin, flexible tube.  How is this treated? There is no cure for IBS, but treatment can help relieve symptoms. IBS treatment often includes:  Changes to your diet, such as: ? Eating more fiber. ? Avoiding foods that cause symptoms. ? Drinking more water. ? Eating regular, medium-sized portioned meals.  Medicines. These may include: ? Fiber supplements if you have constipation. ? Medicine to control diarrhea (antidiarrheal medicines). ? Medicine to help control muscle spasms in your GI tract (antispasmodic medicines). ? Medicines to help with any mental health issues, such as antidepressants or tranquilizers.  Therapy. ? Talk therapy may help with anxiety, depression, or other mental health issues that can make IBS symptoms worse.  Stress reduction. ? Managing your stress can help keep symptoms under control.  Follow these instructions at home:  Take medicines only as directed by your health care provider.  Eat a healthy diet. ? Avoid foods and drinks with added sugar. ? Include more whole grains, fruits, and vegetables gradually into your diet. This may be especially helpful if you have IBS with constipation. ? Avoid any foods and drinks that make your symptoms worse. These may include dairy products and caffeinated or carbonated drinks. ? Do not eat large meals. ? Drink enough   fluid to keep your urine clear or pale yellow.  Exercise regularly. Ask your health care provider for recommendations of good activities for you.  Keep all follow-up visits as directed by your health care provider. This is important. Contact a health care provider if:  You have constant pain.  You have trouble or pain with swallowing.  You have worsening diarrhea. Get help right away  if:  You have severe and worsening abdominal pain.  You have diarrhea and: ? You have a rash, stiff neck, or severe headache. ? You are irritable, sleepy, or difficult to awaken. ? You are weak, dizzy, or extremely thirsty.  You have bright red blood in your stool or you have black tarry stools.  You have unusual abdominal swelling that is painful.  You vomit continuously.  You vomit blood (hematemesis).  You have both abdominal pain and a fever. This information is not intended to replace advice given to you by your health care provider. Make sure you discuss any questions you have with your health care provider. Document Released: 06/03/2005 Document Revised: 11/03/2015 Document Reviewed: 02/18/2014 Elsevier Interactive Patient Education  2018 [employer]  

## 2018-05-21 NOTE — Progress Notes (Signed)
Melodie Bouillon, MD [address]  [address]  [place], [place] [address]  Main: [phone]  Fax: [phone]   Primary Care Physician: Kerman Passey, MD  Primary Gastroenterologist:  Dr. Melodie Bouillon  Chief Complaint  Patient presents with  . Abdominal Pain    Diarrhea    HPI: Elizabeth Velez is a 22 y.o. female here for follow-up of diarrhea and abdominal pain.  Reports 3-4 loose bowel movements a day without blood.  No nausea or vomiting.  Was asked to try Metamucil on last visit but does not take it.  Recently had a CT scan by her primary care provider due to abdominal pain, and reports a normal appendix, no bowel obstruction, normal terminal ileum.  "A Cause for patient symptoms has not been established with the study".  Patient reports intermittent right lower quadrant pain cramping, dull, nonradiating, 2/10.  Does not remove after bowel movements.  Does not worsen with eating.  Does not have any relieving or worsening factors.  No weight loss.  States has history of depression and goes to therapy.  Does not notice that her symptoms are worse when she is more anxious.  No family history of colon cancer.  Recent CBC and CMP are reassuring.  Current Outpatient Medications  Medication Sig Dispense Refill  . fluticasone (FLONASE) 50 MCG/ACT nasal spray Place 2 sprays into both nostrils daily. 16 g 6  . lamoTRIgine (LAMICTAL) 200 MG tablet Take 1 tablet (200 mg total) by mouth daily. 90 tablet 0  . levonorgestrel-ethinyl estradiol (LARISSIA) 0.1-20 MG-MCG tablet Take 1 tablet by mouth daily.    . propranolol (INDERAL) 10 MG tablet     . sertraline (ZOLOFT) 25 MG tablet     . butalbital-acetaminophen-caffeine (FIORICET, ESGIC) 50-325-40 MG tablet Take 1 tablet by mouth every 6 (six) hours as needed for headache or migraine. (Patient not taking: Reported on 05/08/2018) 20 tablet 0  . LORazepam (ATIVAN) 0.5 MG tablet Take by mouth.    . Melatonin 3 MG TABS Take by  mouth as needed.     . Phenol-Glycerin (CHLORASEPTIC MAX SORE THROAT) 1.5-33 % LIQD Take as directed on bottle. (Patient not taking: Reported on 05/13/2018) 1 Bottle 0  . promethazine (PHENERGAN) 12.5 MG tablet Take 1 tablet (12.5 mg total) by mouth every 8 (eight) hours as needed for nausea or vomiting. Can cause drowsiness. (Patient not taking: Reported on 05/13/2018) 15 tablet 0   No current facility-administered medications for this visit.     Allergies as of 05/21/2018 - Review Complete 05/21/2018  Allergen Reaction Noted  . Penicillins  08/02/2014  . Zithromax [azithromycin]  08/02/2014    ROS:  General: Negative for anorexia, weight loss, fever, chills, fatigue, weakness. ENT: Negative for hoarseness, difficulty swallowing , nasal congestion. CV: Negative for chest pain, angina, palpitations, dyspnea on exertion, peripheral edema.  Respiratory: Negative for dyspnea at rest, dyspnea on exertion, cough, sputum, wheezing.  GI: See history of present illness. GU:  Negative for dysuria, hematuria, urinary incontinence, urinary frequency, nocturnal urination.  Endo: Negative for unusual weight change.    Physical Examination:   BP 119/74   Pulse 94   Ht 4\' 11"  (1.499 m)   Wt 110 lb 6.4 oz (50.1 kg)   BMI 22.30 kg/m   General: Well-nourished, well-developed in no acute distress.  Eyes: No icterus. Conjunctivae pink. Mouth: Oropharyngeal mucosa moist and pink , no lesions erythema or exudate. Neck: Supple, Trachea midline Abdomen: Bowel sounds are normal, nontender,  nondistended, no hepatosplenomegaly or masses, no abdominal bruits or hernia , no rebound or guarding.   Extremities: No lower extremity edema. No clubbing or deformities. Neuro: Alert and oriented x 3.  Grossly intact. Skin: Warm and dry, no jaundice.   Psych: Alert and cooperative, normal mood and affect.   Labs: CMP     Component Value Date/Time   NA 138 05/13/2018 0941   NA 140 03/29/2015 1038   NA 140  10/10/2013 1548   K 4.0 05/13/2018 0941   K 4.2 10/10/2013 1548   CL 102 05/13/2018 0941   CL 107 10/10/2013 1548   CO2 26 05/13/2018 0941   CO2 29 10/10/2013 1548   GLUCOSE 98 05/13/2018 0941   GLUCOSE 91 10/10/2013 1548   BUN 11 05/13/2018 0941   BUN 11 03/29/2015 1038   BUN 12 10/10/2013 1548   CREATININE 0.73 05/13/2018 0941   CREATININE 0.84 05/08/2018 1244   CALCIUM 8.8 (L) 05/13/2018 0941   CALCIUM 8.8 (L) 10/10/2013 1548   PROT 7.1 05/13/2018 0941   PROT 6.7 03/29/2015 1038   PROT 7.0 10/10/2013 1548   ALBUMIN 3.8 05/13/2018 0941   ALBUMIN 4.2 03/29/2015 1038   ALBUMIN 3.5 (L) 10/10/2013 1548   AST 19 05/13/2018 0941   AST 27 (H) 10/10/2013 1548   ALT 13 05/13/2018 0941   ALT 18 10/10/2013 1548   ALKPHOS 38 05/13/2018 0941   ALKPHOS 44 (L) 10/10/2013 1548   BILITOT 0.8 05/13/2018 0941   BILITOT 0.5 03/29/2015 1038   BILITOT 0.5 10/10/2013 1548   GFRNONAA >60 05/13/2018 0941   GFRNONAA 97 05/08/2018 1244   GFRAA >60 05/13/2018 0941   GFRAA 113 05/08/2018 1244   Lab Results  Component Value Date   WBC 7.3 05/13/2018   HGB 14.4 05/13/2018   HCT 42.2 05/13/2018   MCV 96.1 05/13/2018   PLT 226 05/13/2018    Imaging Studies: Ct Abdomen Pelvis W Contrast  Result Date: 05/13/2018 CLINICAL DATA:  Lower abdominal pain, right-sided EXAM: CT ABDOMEN AND PELVIS WITH CONTRAST TECHNIQUE: Multidetector CT imaging of the abdomen and pelvis was performed using the standard protocol following bolus administration of intravenous contrast. Oral contrast was also administered. CONTRAST:  75mL ISOVUE-300 IOPAMIDOL (ISOVUE-300) INJECTION 61% COMPARISON:  None. FINDINGS: Lower chest: Lung bases are clear. Hepatobiliary: No focal liver lesions are appreciable. There is mild fatty infiltration near the fissure for the ligamentum teres. Gallbladder wall is not appreciably thickened. There is no biliary duct dilatation. Pancreas: No pancreatic mass or inflammatory focus. Spleen: Spleen  appears upper normal in size without focal splenic lesion evident. Adrenals/Urinary Tract: Adrenals bilaterally appear unremarkable. Kidneys bilaterally show no evident mass or hydronephrosis on either side. There is no evident renal or ureteral calculus on either side. Urinary bladder is midline with wall thickness within normal limits. Stomach/Bowel: There is no appreciable bowel wall or mesenteric thickening. Terminal ileum appears normal. There is no evident bowel obstruction. There is no free air or portal venous air. Vascular/Lymphatic: There is no abdominal aortic aneurysm. No vascular lesions are evident. There is no adenopathy in the abdomen or pelvis. Reproductive: Uterus is anteverted and mildly canted to the right. No pelvic mass evident. Other: Appendix appears unremarkable. No abscess or ascites is evident in the abdomen or pelvis. There is a minimal ventral hernia containing only fat. Musculoskeletal: There are no evident blastic or lytic bone lesions. There is no intramuscular lesion. IMPRESSION: 1. A cause for patient's symptoms has not been established with this  study. 2. Appendix appears normal. No bowel obstruction evident. Terminal ileum appears normal. No abscess evident in the abdomen or pelvis. 3.  No appreciable renal or ureteral calculus.  No hydronephrosis. 4.  Minimal ventral hernia containing only fat. These results will be called to the ordering clinician or representative by the Radiology Department at the imaging location. Electronically Signed   By: Bretta Bang III M.D.   On: 05/13/2018 12:03    Assessment and Plan:   ADRINE HAYWORTH is a 22 y.o. y/o female with right lower quadrant abdominal pain with negative CT and chronic diarrhea  Present likely functional, possible IBS We discussed cognitive behavior therapy but patient would not like this referral at this time  We will obtain stool studies to rule out infectious causes and fecal calprotectin as well Signs  and symptoms are not consistent with IBD If fecal calprotectin is elevated can consider colonoscopy  We discussed option of proceeding with colonoscopy to rule out any underlying inflammation in his Up on the CT scan but patient would like to hold off and do stool studies instead  We will also check inflammatory markers and if these are normal it would also be consistent with symptoms beginning functional in nature  Dr Melodie Bouillon

## 2018-05-22 LAB — SEDIMENTATION RATE: SED RATE: 13 mm/h (ref 0–32)

## 2018-05-22 LAB — C-REACTIVE PROTEIN: CRP: 3 mg/L (ref 0–10)

## 2018-05-25 ENCOUNTER — Ambulatory Visit: Payer: Self-pay

## 2018-05-29 ENCOUNTER — Telehealth: Payer: Self-pay

## 2018-05-29 NOTE — Telephone Encounter (Signed)
-----   Message from Pasty SpillersVarnita B Tahiliani, MD sent at 05/27/2018  9:19 AM EST ----- Elizabeth Velez please let patient know, her blood work was normal, her inflammatory markers are normal which is reassuring.  We are awaiting her stool studies.

## 2018-05-29 NOTE — Telephone Encounter (Signed)
Left generic message that labs were normal and we are awaiting her sample for the rest of labs.

## 2018-07-21 ENCOUNTER — Other Ambulatory Visit: Payer: Self-pay

## 2018-07-21 ENCOUNTER — Ambulatory Visit (INDEPENDENT_AMBULATORY_CARE_PROVIDER_SITE_OTHER): Payer: BC Managed Care – PPO | Admitting: Nurse Practitioner

## 2018-07-21 ENCOUNTER — Encounter: Payer: Self-pay | Admitting: Nurse Practitioner

## 2018-07-21 VITALS — BP 100/68 | HR 95 | Temp 98.4°F | Resp 16 | Ht 59.0 in | Wt 110.8 lb

## 2018-07-21 DIAGNOSIS — R509 Fever, unspecified: Secondary | ICD-10-CM

## 2018-07-21 DIAGNOSIS — Z20828 Contact with and (suspected) exposure to other viral communicable diseases: Secondary | ICD-10-CM | POA: Diagnosis not present

## 2018-07-21 DIAGNOSIS — R519 Headache, unspecified: Secondary | ICD-10-CM

## 2018-07-21 DIAGNOSIS — R51 Headache: Secondary | ICD-10-CM | POA: Diagnosis not present

## 2018-07-21 LAB — POCT INFLUENZA A/B
Influenza A, POC: NEGATIVE
Influenza B, POC: NEGATIVE

## 2018-07-21 MED ORDER — OSELTAMIVIR PHOSPHATE 75 MG PO CAPS: 75.0000 mg | ORAL_CAPSULE | Freq: Two times a day (BID) | ORAL | 0 refills | Status: DC

## 2018-07-21 NOTE — Progress Notes (Signed)
Name: Elizabeth Velez MRN: [id number] DOB: [birth year] Date:07/21/2018 Progress Note Subjective Chief Complaint Chief Complaint  Patient presents with  . Fever    low grade fever today 101.0  . Headache    for 3 days   HPI Patient presents to the clinic with headache, mostly in the frontal region bilaterally that sometimes travels to mid-cranial region.Pt has a history of migraine, but this is different than her typical migraine.This started 3 days ago. She has been taking Excedrin OTC with little relief. 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 Temperature was 101 and she took ibuprofen at 12 pm. She has been in contact with individuals who have the flu. Endorses right ear pain. Denies shortness of breath, chest pain, rhinorrhea, sneezing, or cough, rash, nausea, vomiting.    Patient Active Problem List   Diagnosis Date Noted  . Ventral hernia without obstruction or gangrene 05/13/2018  . IBS (irritable bowel syndrome) 09/26/2017  . Preventative health care 12/02/2016  . History of rectal bleeding 09/06/2016  . Abnormal loss of weight 07/15/2016  . Muscle cramps 02/12/2016  . Joint crepitus 12/29/2015  . Raynaud phenomenon 12/29/2015  . Unexplained night sweats 12/29/2015  . Heart murmur previously undiagnosed 03/29/2015  . Oral contraceptive pill surveillance 03/29/2015  . 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 Major depressive disorder, recurrent, in remission (HCC) 08/02/2014   Past Medical History:  Diagnosis Date  . Anxiety   . Bipolar 1 disorder, depressed (HCC)   . Depression   . Depression 08/02/2014  . Excessive daytime sleepiness 08/02/2014  . 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 Migraine   . Ventral hernia without obstruction or gangrene 05/13/2018   CT scan Nov 2019   Past Surgical History:  Procedure Laterality Date  . WISDOM TOOTH EXTRACTION Bilateral 11/22/2016   Social History   Tobacco Use  .  Smoking status: Never Smoker  . Smokeless tobacco: Never Used  Substance Use Topics  . Alcohol use: No    Alcohol/week: 0.0 standard drinks    Frequency: Never    Comment: only once a month    Current Outpatient Medications:  .  fluticasone (FLONASE) 50 MCG/ACT nasal spray, Place 2 sprays into both nostrils daily., Disp: 16 g, Rfl: 6 .  lamoTRIgine (LAMICTAL) 200 MG tablet, Take 1 tablet (200 mg total) by mouth daily., Disp: 90 tablet, Rfl: 0 .  levonorgestrel-ethinyl estradiol (LARISSIA) 0.1-20 MG-MCG tablet, Take 1 tablet by mouth daily., Disp: , Rfl:  .  Melatonin 3 MG TABS, Take by mouth as needed. , Disp: , Rfl:  .  propranolol (INDERAL) 10 MG tablet, , Disp: , Rfl:  .  sertraline (ZOLOFT) 25 MG tablet, , Disp: , Rfl:  .  butalbital-acetaminophen-caffeine (FIORICET, ESGIC) 50-325-40 MG tablet, Take 1 tablet by mouth every 6 (six) hours as needed for headache or migraine. (Patient not taking: Reported on 05/08/2018), Disp: 20 tablet, Rfl: 0 .  LORazepam (ATIVAN) 0.5 MG tablet, Take by mouth., Disp: , Rfl:  .  Phenol-Glycerin (CHLORASEPTIC MAX SORE THROAT) 1.5-33 % LIQD, Take as directed on bottle. (Patient not taking: Reported on 05/13/2018), Disp: 1 Bottle, Rfl: 0 .  promethazine (PHENERGAN) 12.5 MG tablet, Take 1 tablet (12.5 mg total) by mouth every 8 (eight) hours as needed for nausea or vomiting. Can cause drowsiness. (Patient not taking: Reported on 05/13/2018), Disp: 15 tablet, Rfl: 0 Allergies  Allergen Reactions  . Penicillins   . Zithromax [Azithromycin]  Review of Systems  Constitutional: Positive for malaise/fatigue.  HENT: Negative for sore throat.   Eyes: Negative for blurred vision.  Respiratory: Negative for wheezing.    No other specific complaints in a complete review of systems (except as listed in HPI above). Objective Vitals:   07/21/18 1349  BP: 100/68  Pulse: 95  Resp: 16  Temp: 98.4 F (36.9 C)  TempSrc: Oral  SpO2: 95%  Weight: 110 lb 12.8 oz  (50.3 kg)  Height: 4\' 11"  (1.499 m)    Body mass index is 22.38 kg/m. Nursing Note and Vital Signs reviewed. Physical Exam Constitutional:      Appearance: She is normal weight.  HENT:     Head: Normocephalic.     Right Ear: Hearing normal. Tenderness present.     Left Ear: Hearing normal. No tenderness.     Nose: Nose normal.     Right Sinus: No maxillary sinus tenderness or frontal sinus tenderness.     Left Sinus: No maxillary sinus tenderness or frontal sinus tenderness.     Mouth/Throat:     Mouth: Mucous membranes are moist.     Pharynx: No pharyngeal swelling or posterior oropharyngeal erythema.  Cardiovascular:     Rate and Rhythm: Normal rate and regular rhythm.  Pulmonary:     Effort: Pulmonary effort is normal.     Breath sounds: Normal breath sounds.  Abdominal:     Palpations: Abdomen is soft.  Neurological:     Mental Status: She is alert.    ? No results found for this or any previous visit (from the past 48 hour(s)). Assessment & Plan 1. Fever, unspecified fever cause Negative flu swab, However suspect flu -like illness likely decreased symptoms due to vacination. Continue to obtain proper rest and hydration. Alternate between ibuprofen and tylenol to help with fever. Take medication as prescribed. If fever worsens with other symptoms, such as sinus tenderness, cough, sore throat then please follow-up at the clinic. - POCT Influenza A/B - oseltamivir (TAMIFLU) 75 MG capsule; Take 1 capsule (75 mg total) by mouth 2 (two) times daily.  Dispense: 10 capsule; Refill: 0  2. Acute nonintractable headache, unspecified headache type Most likely related to flu-like symptoms. Take medication as prescribed. Ibuprofen and acetaminophen should help. If symptoms worsen then please follow-up at the clinic. - POCT Influenza A/B - oseltamivir (TAMIFLU) 75 MG capsule; Take 1 capsule (75 mg total) by mouth 2 (two) times daily.  Dispense: 10 capsule; Refill: 0  3. Exposure to  the flu Negative flu swab. However, patient has been in recent contact with individuals with the flu. Patient presents with headache, fatigue, and fever without unspecified origin. Will treat for flu. If symptoms does not - oseltamivir (TAMIFLU) 75 MG capsule; Take 1 capsule (75 mg total) by mouth 2 (two) times daily.  Dispense: 10 capsule; Refill: 0  ? -Red flags and when to present for emergency care or RTC including fever >101.64F, chest pain, shortness of breath, new/worsening/un-resolving symptoms, reviewed with patient at time of visit. Follow up and care instructions discussed and provided in AVS.

## 2018-07-21 NOTE — Patient Instructions (Signed)

## 2018-08-24 ENCOUNTER — Ambulatory Visit (INDEPENDENT_AMBULATORY_CARE_PROVIDER_SITE_OTHER): Payer: BC Managed Care – PPO | Admitting: Nurse Practitioner

## 2018-08-24 ENCOUNTER — Encounter: Payer: Self-pay | Admitting: Nurse Practitioner

## 2018-08-24 VITALS — BP 116/70 | HR 99 | Temp 98.6°F | Resp 14 | Ht 59.0 in | Wt 112.1 lb

## 2018-08-24 DIAGNOSIS — J0100 Acute maxillary sinusitis, unspecified: Secondary | ICD-10-CM | POA: Diagnosis not present

## 2018-08-24 DIAGNOSIS — R51 Headache: Secondary | ICD-10-CM | POA: Diagnosis not present

## 2018-08-24 DIAGNOSIS — R05 Cough: Secondary | ICD-10-CM

## 2018-08-24 DIAGNOSIS — R519 Headache, unspecified: Secondary | ICD-10-CM

## 2018-08-24 DIAGNOSIS — R059 Cough, unspecified: Secondary | ICD-10-CM

## 2018-08-24 DIAGNOSIS — R0981 Nasal congestion: Secondary | ICD-10-CM

## 2018-08-24 DIAGNOSIS — R040 Epistaxis: Secondary | ICD-10-CM

## 2018-08-24 LAB — POCT INFLUENZA A/B
Influenza A, POC: NEGATIVE
Influenza B, POC: NEGATIVE

## 2018-08-24 MED ORDER — DOXYCYCLINE HYCLATE 100 MG PO TABS: 100.0000 mg | ORAL_TABLET | Freq: Two times a day (BID) | ORAL | 0 refills | Status: DC

## 2018-08-24 NOTE — Progress Notes (Addendum)
Name: Elizabeth Velez   MRN: [id number]    DOB: 1995-10-26   Date:08/24/2018       Progress Note  Subjective  Chief Complaint  Chief Complaint  Patient presents with  . URI    onset 3 days, symptoms include: vomiting, nose bleed, congestion, a little cough and sore throat and headache    HPI Patient woke up with sore throat Saturday morning, progressed to nasal congestion and mild intermittent dry cough, frontal headache.  States on Sunday had nose bleed- lasted about 20 minutes- tried using cold cloth and applying pressure.  She then threw up afterwards. Denies abdominal pain, nausea, diarrhea. No vomiting since that episode.    Has used nyquil and sudafed.   Depression screen [facility] 2/9 08/24/2018 07/21/2018 05/13/2018 05/08/2018 01/15/2018  Decreased Interest 0 0 1 0 0  Down, Depressed, Hopeless 0 0 1 0 1  PHQ - 2 Score 0 0 2 0 1  Altered sleeping 0 0 1 1 0  Tired, decreased energy 0 2 3 1  0  Change in appetite 0 0 3 3 0  Feeling bad or failure about yourself  0 0 0 0 0  Trouble concentrating 0 0 0 0 0  Moving slowly or fidgety/restless 0 0 0 0 0  Suicidal thoughts 0 0 0 0 0  PHQ-9 Score 0 2 9 5 1   Difficult doing work/chores Not difficult at all Not difficult at all Not difficult at all - Not difficult at all   Negative for depression.   Patient Active Problem List   Diagnosis Date Noted  . Ventral hernia without obstruction or gangrene 05/13/2018  . IBS (irritable bowel syndrome) 09/26/2017  . Preventative health care 12/02/2016  . History of rectal bleeding 09/06/2016  . Abnormal loss of weight 07/15/2016  . Muscle cramps 02/12/2016  . Joint crepitus 12/29/2015  . Raynaud phenomenon 12/29/2015  . Unexplained night sweats 12/29/2015  . Heart murmur previously undiagnosed 03/29/2015  . Oral contraceptive pill surveillance 03/29/2015  . 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 Major depressive disorder, recurrent, in remission (HCC) 08/02/2014    Past  Medical History:  Diagnosis Date  . Anxiety   . Bipolar 1 disorder, depressed (HCC)   . Depression   . Depression 08/02/2014  . Excessive daytime sleepiness 08/02/2014  . 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 Migraine   . Ventral hernia without obstruction or gangrene 05/13/2018   CT scan Nov 2019    Past Surgical History:  Procedure Laterality Date  . WISDOM TOOTH EXTRACTION Bilateral 11/22/2016    Social History   Tobacco Use  . Smoking status: Never Smoker  . Smokeless tobacco: Never Used  Substance Use Topics  . Alcohol use: No    Alcohol/week: 0.0 standard drinks    Frequency: Never    Comment: only once a month     Current Outpatient Medications:  .  butalbital-acetaminophen-caffeine (FIORICET, ESGIC) 50-325-40 MG tablet, Take 1 tablet by mouth every 6 (six) hours as needed for headache or migraine., Disp: 20 tablet, Rfl: 0 .  fluticasone (FLONASE) 50 MCG/ACT nasal spray, Place 2 sprays into both nostrils daily., Disp: 16 g, Rfl: 6 .  lamoTRIgine (LAMICTAL) 200 MG tablet, Take 1 tablet (200 mg total) by mouth daily., Disp: 90 tablet, Rfl: 0 .  levonorgestrel-ethinyl estradiol (LARISSIA) 0.1-20 MG-MCG tablet, Take 1 tablet by mouth daily., Disp: , Rfl:  .  propranolol (INDERAL) 10 MG tablet, Take 10 mg by mouth  daily as needed. , Disp: , Rfl:  .  sertraline (ZOLOFT) 25 MG tablet, Take 25 mg by mouth daily. , Disp: , Rfl:  .  LORazepam (ATIVAN) 0.5 MG tablet, Take by mouth., Disp: , Rfl:  .  Melatonin 3 MG TABS, Take by mouth as needed. , Disp: , Rfl:  .  oseltamivir (TAMIFLU) 75 MG capsule, Take 1 capsule (75 mg total) by mouth 2 (two) times daily. (Patient not taking: Reported on 08/24/2018), Disp: 10 capsule, Rfl: 0 .  Phenol-Glycerin (CHLORASEPTIC MAX SORE THROAT) 1.5-33 % LIQD, Take as directed on bottle. (Patient not taking: Reported on 05/13/2018), Disp: 1 Bottle, Rfl: 0 .  promethazine (PHENERGAN) 12.5 MG tablet, Take 1 tablet (12.5 mg total) by  mouth every 8 (eight) hours as needed for nausea or vomiting. Can cause drowsiness. (Patient not taking: Reported on 05/13/2018), Disp: 15 tablet, Rfl: 0  Allergies  Allergen Reactions  . Penicillins   . Zithromax [Azithromycin]     ROS   No other specific complaints in a complete review of systems (except as listed in HPI above).  Objective  Vitals:   08/24/18 1155  BP: 116/70  Pulse: 99  Resp: 14  Temp: 98.6 F (37 C)  TempSrc: Oral  SpO2: 94%  Weight: 112 lb 1.6 oz (50.8 kg)  Height: 4\' 11"  (1.499 m)   Body mass index is 22.64 kg/m.  Nursing Note and Vital Signs reviewed.  Physical Exam HENT:     Head: Normocephalic and atraumatic.     Right Ear: Hearing, tympanic membrane, ear canal and external ear normal.     Left Ear: Hearing, tympanic membrane, ear canal and external ear normal.     Nose: Nasal tenderness and congestion present. No signs of injury or mucosal edema.     Right Nostril: No epistaxis.     Left Nostril: No epistaxis.     Right Sinus: Maxillary sinus tenderness and frontal sinus tenderness present.     Left Sinus: Maxillary sinus tenderness and frontal sinus tenderness present.     Mouth/Throat:     Mouth: Mucous membranes are moist.     Pharynx: Uvula midline. No oropharyngeal exudate or posterior oropharyngeal erythema.  Eyes:     General:        Right eye: No discharge.        Left eye: No discharge.     Conjunctiva/sclera: Conjunctivae normal.  Neck:     Musculoskeletal: Normal range of motion.  Cardiovascular:     Rate and Rhythm: Normal rate.  Pulmonary:     Effort: Pulmonary effort is normal.     Breath sounds: Normal breath sounds.  Lymphadenopathy:     Cervical: No cervical adenopathy.  Skin:    General: Skin is warm and dry.     Findings: No rash.  Neurological:     Mental Status: She is alert.  Psychiatric:        Judgment: Judgment normal.        No results found for this or any previous visit (from the past 48  hour(s)).  Assessment & Plan  1. Acute non-recurrent maxillary sinusitis Discussed likely viral course and over-the-counter medications to help.  Discussed delayed antibiotic prescribing sent prescription if symptoms acutely worsen over the next 2 to 3 days or do not improve in a week - doxycycline (VIBRA-TABS) 100 MG tablet; Take 1 tablet (100 mg total) by mouth 2 (two) times daily.  Dispense: 20 tablet; Refill: 0  2. Epistaxis Discussed  proper epistasis care with direct pressure for 15 minutes unhindered.  Use humidifier and nasal saline spray to keep nose lubricated for prevention  3. Acute intractable headache, unspecified headache type Alternate between acetaminophen and ibuprofen.  Drink plenty of water and rest  4. Nasal congestion Flonase  5. Cough - POCT Influenza A/B

## 2018-08-24 NOTE — Patient Instructions (Addendum)
-   Please use nasal saline spray twice a day to lubricate nasal passages - Use flonase 1-2 sprays in each nostril daily - use humidifier as dry air makes it more likely for nose bleeds - Drink plenty of water   - Your sinus infection seems viral and will likely not improve with antibiotics however if your symptoms suddenly worsen at any time or do not improve in the next week please start taking antibiotic and take it for the complete course.   Nosebleed self-care. With the right self-care, most nosebleeds stop on their own. Here's what you should do: 1. Blow your nose. This might increase the bleeding for a moment, but that's OK. 2. Sit or stand while bending forward a little at the waist. DO NOT lie down or tilt your head back. 3. Pinch the soft area towards the bottom of your nose, below the bone. Do NOT grip the bridge of your nose between your eyes. That will not work. DO NOT press on just 1 side, even if the bleeding is only on 1 side. That will not work either. 4. Squeeze your nose shut for at least 15 minutes. (In children, squeeze for only 5 minutes.) Use a clock to time yourself. Do not release the pressure before the time is up to check if the bleeding has stopped. If you keep checking, you will ruin your chances of getting the bleeding to stop. If you follow these steps, and your nose keeps bleeding, repeat all the steps once more. Apply pressure for a total of at least 30 minutes (or 10 minutes for children). If you are still bleeding, go to the emergency room or an urgent care clinic.  General instructions for other sinusitis and upper respiratory symptoms below:  For Fever/Pain: Acetaminophen every 6 hours as needed (maximum of 3000mg  a day). If you are still uncomfortable you can add ibuprofen OR naproxen  For coughing: try dextromethorphan for a cough suppressant, and/or a cool mist humidifier, lozenges  For sore throat: saline gargles, honey herbal tea, lozenges, throat spray  To  dry out your nose: try an antihistamine like loratadine (non-sedating) or diphenhydramine (sedating) or others To relieve a stuffy nose: try an oral decongestant  Like pseudoephedrine if you are under the age of 35 and do not have high blood pressure, neti pot To make blowing your nose easier and relieve chest congestion: guaifenesin 400mg  every 4-6 hours of guaifenesin ER [phone] mg every 12 hours. Do not take more than 2,400mg  a day.

## 2018-12-22 ENCOUNTER — Encounter: Payer: Self-pay | Admitting: Family Medicine

## 2018-12-22 ENCOUNTER — Ambulatory Visit: Payer: Self-pay | Admitting: *Deleted

## 2018-12-22 NOTE — Telephone Encounter (Signed)
Currently not testing asymptomatic patients at [facility].  Recommend 14 days self-isolation from last day of contact. Discuss COVID signs and symptoms, OTC antipyretic use for fevers and aches.  I suspect you may have COVID-19/coronavirus and recommend that you are tested to confirm.  Please call [facility]'s COVID-19 hotline at [phone] to determine if you are eligible for testing. ____ Can make appointment if she would like to discuss education further

## 2018-12-22 NOTE — Telephone Encounter (Signed)
Pt called stating that she came in contact with her friend's boyfriend who later tested positive for COVD; the last encounter was 12/05/2018; recommendations made per nurse triage protocol; pt also encouraged to wear a mask; she verbalized understanding; the pt saw Dr Sanda Klein, [place]; will route to office for notification.  Reason for Disposition . [1] COVID-19 EXPOSURE AND [2] 15 or more days ago AND [3] NO symptoms  Answer Assessment - Initial Assessment Questions 1. CLOSE CONTACT: "Who is the person with the confirmed or suspected COVID-19 infection that you were exposed to?"    Friend's boyfriend 2. PLACE of CONTACT: "Where were you when you were exposed to COVID-19?" (e.g., home, school, medical waiting room; which city?)     [facility] in [place], [place] 3. TYPE of CONTACT: "How much contact was there?" (e.g., sitting next to, live in same house, work in same office, same building)     Same table 4. DURATION of CONTACT: "How long were you in contact with the COVID-19 patient?" (e.g., a few seconds, passed by person, a few minutes, live with the patient)     A few hours 5. DATE of CONTACT: "When did you have contact with a COVID-19 patient?" (e.g., how many days ago)     12/08/2018 6. TRAVEL: "Have you traveled out of the country recently?" If so, "When and where?"     * Also ask about out-of-state travel, since the CDC has identified some high-risk cities for community spread in the [place].     * Note: Travel becomes less relevant if there is widespread community transmission where the patient lives.   no 7. COMMUNITY SPREAD: "Are there lots of cases of COVID-19 (community spread) where you live?" (See public health department website, if unsure)       Major community spread 8. SYMPTOMS: "Do you have any symptoms?" (e.g., fever, cough, breathing difficulty)     Diarrhea started 12/18/2018 (pt states this is not abnormal) 9. PREGNANCY OR POSTPARTUM: "Is there any chance you are pregnant?" "When  was your last menstrual period?" "Did you deliver in the last 2 weeks?"     No continuous birth control 10. HIGH RISK: "Do you have any heart or lung problems? Do you have a weak immune system?" (e.g., CHF, COPD, asthma, HIV positive, chemotherapy, renal failure, diabetes mellitus, sickle cell anemia)       "bad immune system;gets sick a lot"  Protocols used: CORONAVIRUS (COVID-19) EXPOSURE-A-AH

## 2018-12-23 NOTE — Telephone Encounter (Signed)
Pt is unable to return to work until being tested and is being tested at [facility].

## 2019-01-24 IMAGING — CT CT ABD-PELV W/ CM
2 of 4 series · 15 of 46 positions shown, 17 images · IV contrast (iopamidol)
Comparison: None.

CLINICAL DATA: Lower abdominal pain, right-sided

EXAM:
CT ABDOMEN AND PELVIS WITH CONTRAST
TECHNIQUE: Multidetector CT imaging of the abdomen and pelvis was performed
using the standard protocol following bolus administration of
intravenous contrast. Oral contrast was also administered.
CONTRAST:  75mL KCU4YG-SOO IOPAMIDOL (KCU4YG-SOO) INJECTION 61%

[Series 2: abd pelvis · axial · 0.60mm/px · z∈[-1475,-1095]mm · 12 of 84 slices shown, 14 images (1 of 2)]
[im 4/84  soft-tissue]
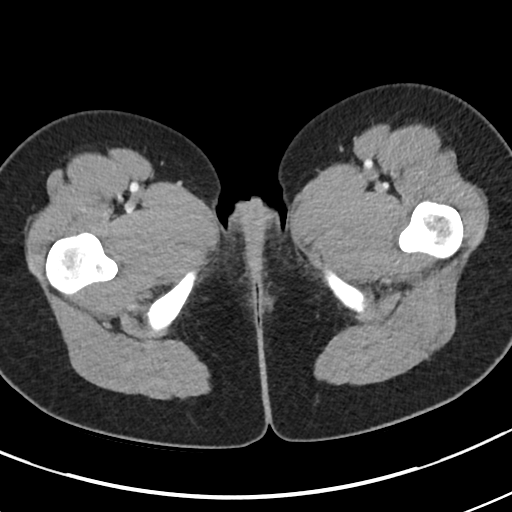
[im 4/84  bone]
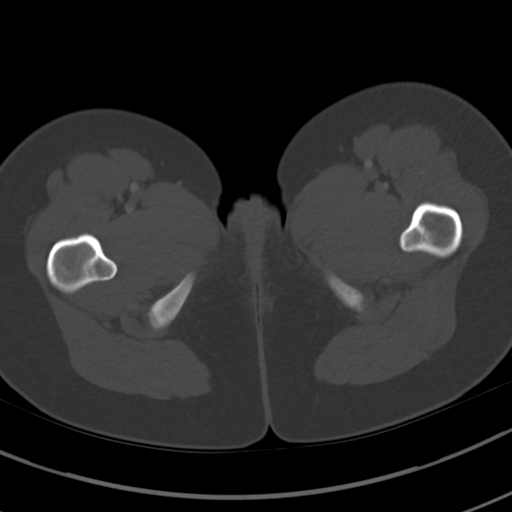
[im 11/84  soft-tissue]
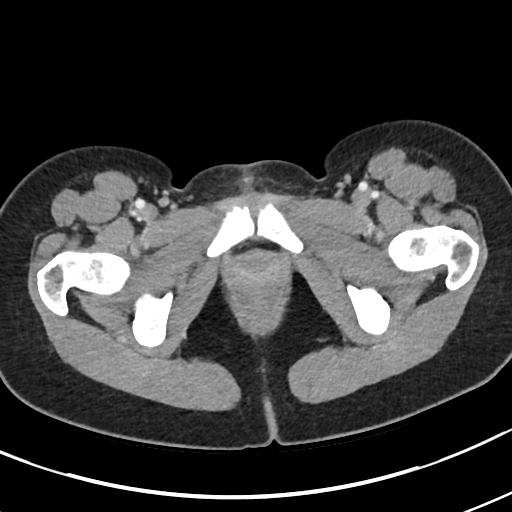
[im 18/84  soft-tissue]
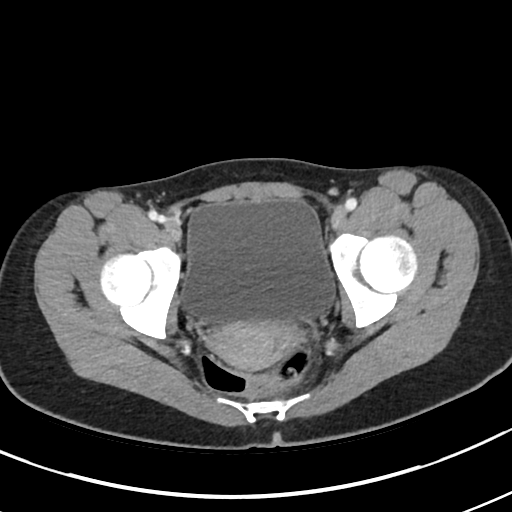
[im 25/84  soft-tissue]
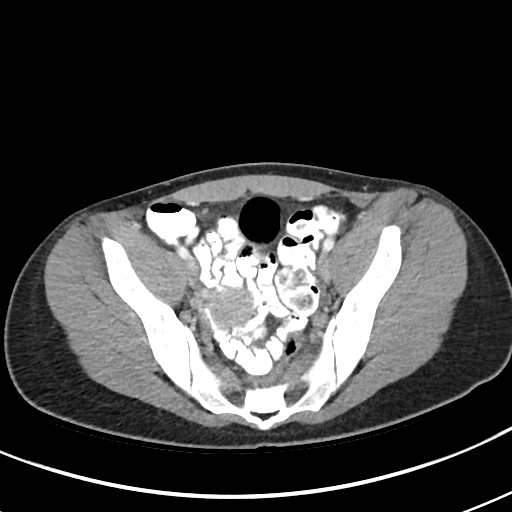
[im 32/84  soft-tissue]
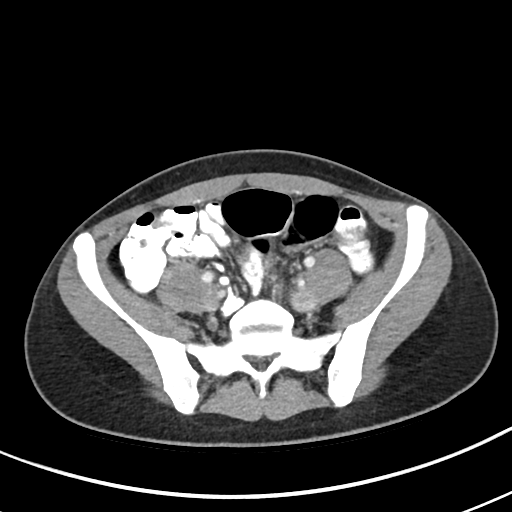
[im 39/84  soft-tissue]
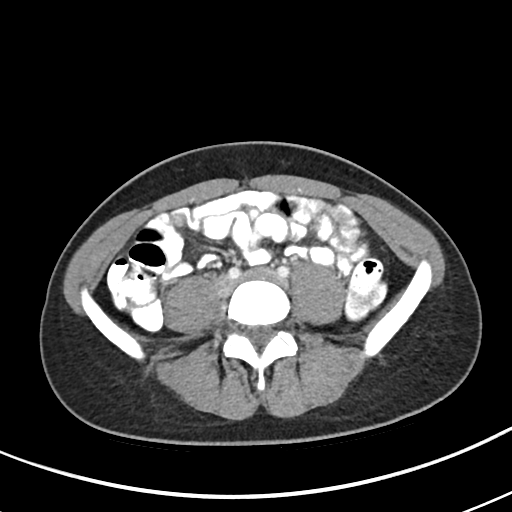
[im 45/84  soft-tissue]
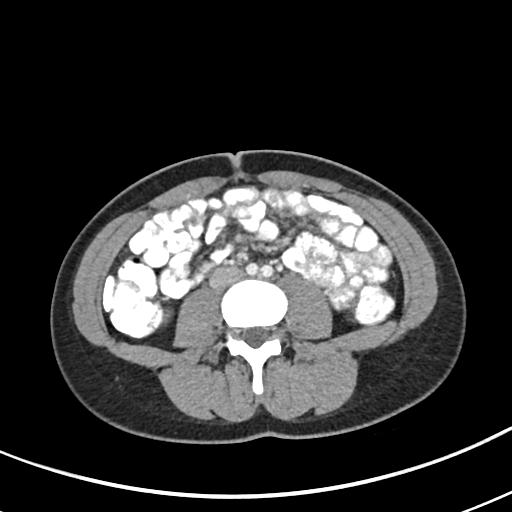
[im 52/84  soft-tissue]
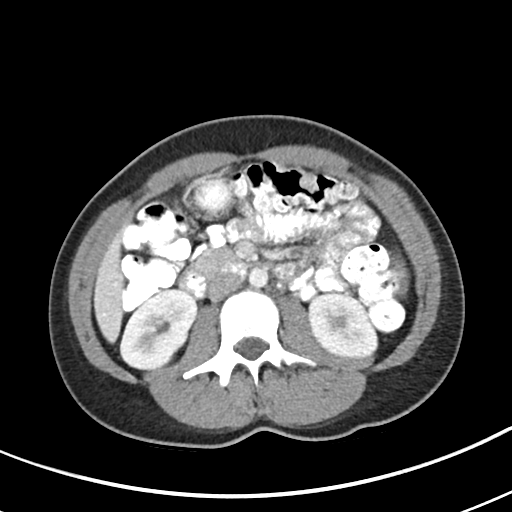
[im 59/84  soft-tissue]
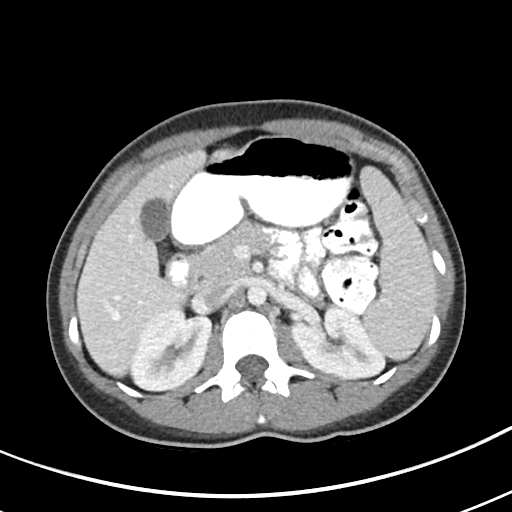
[im 59/84  bone]
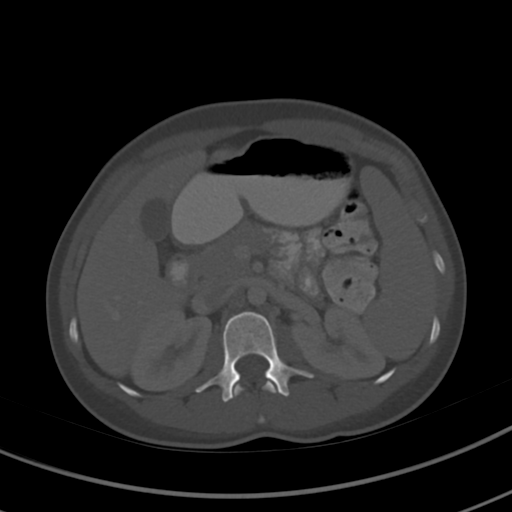
[im 66/84  soft-tissue]
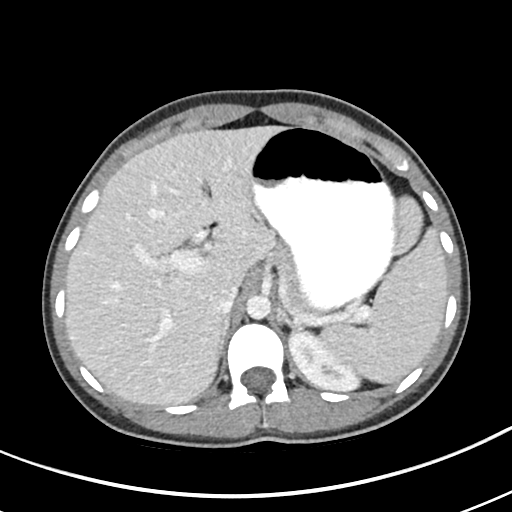
[im 73/84  soft-tissue]
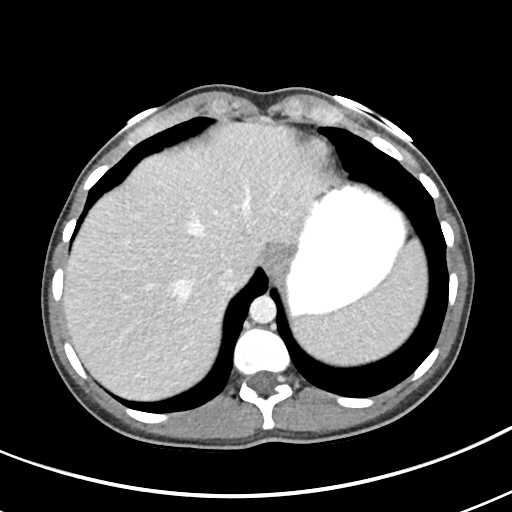
[im 80/84  soft-tissue]
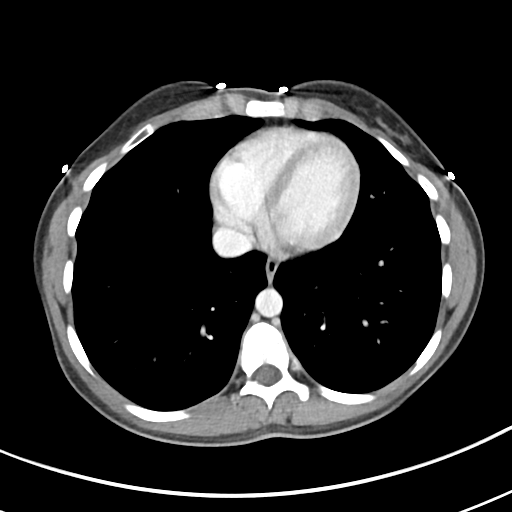

[Series 4: abd pelvis · coronal · 0.60mm/px · 3 of 153 slices shown (2 of 2)]
[im 51/153  soft-tissue]
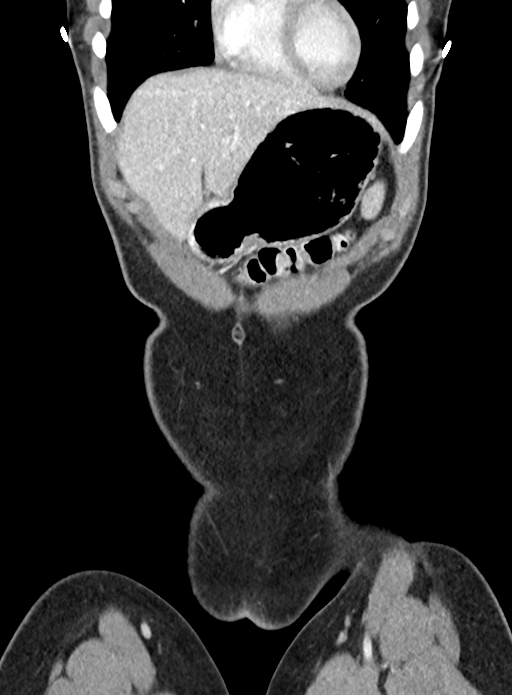
[im 68/153  soft-tissue]
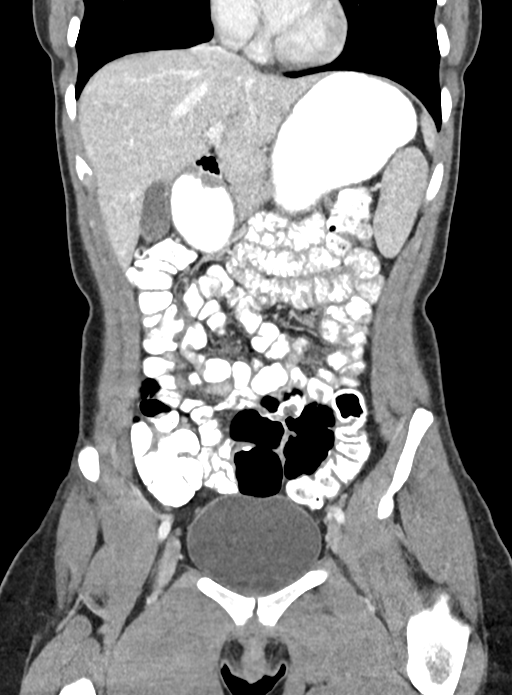
[im 85/153  soft-tissue]
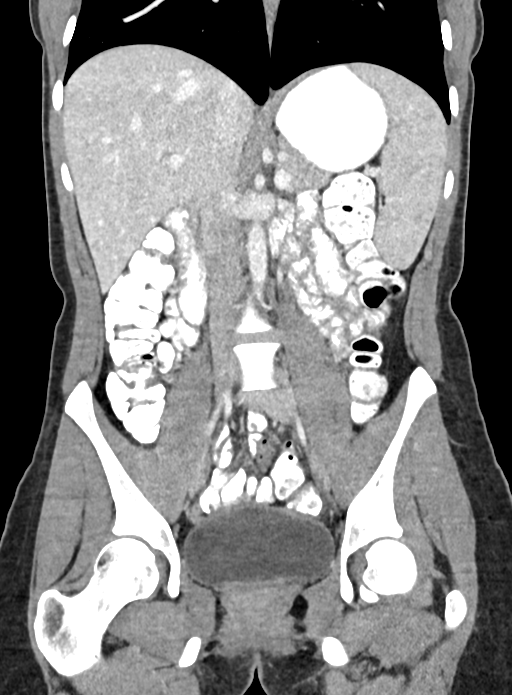

[15 of 46 positions shown; findings below may reference images not displayed]

FINDINGS: Lower chest: Lung bases are clear.

Hepatobiliary: No focal liver lesions are appreciable. There is mild
fatty infiltration near the fissure for the ligamentum teres.
Gallbladder wall is not appreciably thickened. There is no biliary
duct dilatation.

Pancreas: No pancreatic mass or inflammatory focus.

Spleen: Spleen appears upper normal in size without focal splenic
lesion evident.

Adrenals/Urinary Tract: Adrenals bilaterally appear unremarkable.
Kidneys bilaterally show no evident mass or hydronephrosis on either
side. There is no evident renal or ureteral calculus on either side.
Urinary bladder is midline with wall thickness within normal limits.

Stomach/Bowel: There is no appreciable bowel wall or mesenteric
thickening. Terminal ileum appears normal. There is no evident bowel
obstruction. There is no free air or portal venous air.

Vascular/Lymphatic: There is no abdominal aortic aneurysm. No
vascular lesions are evident. There is no adenopathy in the abdomen
or pelvis.

Reproductive: Uterus is anteverted and mildly canted to the right.
No pelvic mass evident.

Other: Appendix appears unremarkable. No abscess or ascites is
evident in the abdomen or pelvis. There is a minimal ventral hernia
containing only fat.

Musculoskeletal: There are no evident blastic or lytic bone lesions.
There is no intramuscular lesion.
IMPRESSION: 1. A cause for patient's symptoms has not been established with this
study.

2. Appendix appears normal. No bowel obstruction evident. Terminal
ileum appears normal. No abscess evident in the abdomen or pelvis.

3.  No appreciable renal or ureteral calculus.  No hydronephrosis.

4.  Minimal ventral hernia containing only fat.

These results will be called to the ordering clinician or
representative by the [HOSPITAL] at the imaging location.

## 2019-05-20 ENCOUNTER — Telehealth: Payer: Self-pay | Admitting: Family Medicine

## 2019-05-21 ENCOUNTER — Other Ambulatory Visit: Payer: Self-pay

## 2019-05-21 ENCOUNTER — Ambulatory Visit (INDEPENDENT_AMBULATORY_CARE_PROVIDER_SITE_OTHER): Payer: Self-pay | Admitting: Family Medicine

## 2019-05-21 ENCOUNTER — Encounter: Payer: Self-pay | Admitting: Family Medicine

## 2019-05-21 DIAGNOSIS — G43009 Migraine without aura, not intractable, without status migrainosus: Secondary | ICD-10-CM

## 2019-05-21 MED ORDER — RIZATRIPTAN BENZOATE 5 MG PO TABS: 5.0000 mg | ORAL_TABLET | ORAL | 1 refills | Status: DC | PRN

## 2019-05-21 NOTE — Progress Notes (Signed)
Name: Elizabeth Velez   MRN: [id number]    DOB: 05-15-1996   Date:05/21/2019       Progress Note  Subjective  Chief Complaint  Chief Complaint  Patient presents with  . Headache    I connected with  [name] on 05/21/19 at  9:20 AM EST by telephone and verified that I am speaking with the correct person using two identifiers.   I discussed the limitations, risks, security and privacy concerns of performing an evaluation and management service by telephone and the availability of in person appointments. Staff also discussed with the patient that there may be a patient responsible charge related to this service. Patient Location: Home Provider Location: Office Additional Individuals present: None  HPI  Pt presents with concern for migraine headaches.  She notes that she has had an increase in her # headache days/month.  She will have a consistent headache for most days out of the month, will take excedrin, then a day or two a week she will develop a migraine and is not able to control the pain.  Limits her Fioricet because she works. 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  She is also taking lamictal 200mg .  - She did see ENT and was told she has a deviated septum and she was started on a nasal spray.  She was also told to see neurology for her headaches. - She drinks a cup of coffee once daily.  Taking about 4 tablets of Excedrin per week.  She notes her diet is much healthier - keeping meal times consistent.  - Discussed risk of stroke taking OCP and history of migraines - reviewed   Patient Active Problem List   Diagnosis Date Noted  . Ventral hernia without obstruction or gangrene 05/13/2018  . IBS (irritable bowel syndrome) 09/26/2017  . Preventative health care 12/02/2016  . History of rectal bleeding 09/06/2016  . Abnormal loss of weight 07/15/2016  . Muscle cramps 02/12/2016  . Joint crepitus 12/29/2015  . Raynaud phenomenon  12/29/2015  . Unexplained night sweats 12/29/2015  . Heart murmur previously undiagnosed 03/29/2015  . Oral contraceptive pill surveillance 03/29/2015  . 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 Major depressive disorder, recurrent, in remission (HCC) 08/02/2014    Social History   Tobacco Use  . Smoking status: Never Smoker  . Smokeless tobacco: Never Used  Substance Use Topics  . Alcohol use: No    Alcohol/week: 0.0 standard drinks    Frequency: Never    Comment: only once a month     Current Outpatient Medications:  .  butalbital-acetaminophen-caffeine (FIORICET, ESGIC) 50-325-40 MG tablet, Take 1 tablet by mouth every 6 (six) hours as needed for headache or migraine., Disp: 20 tablet, Rfl: 0 .  fluticasone (FLONASE) 50 MCG/ACT nasal spray, Place 2 sprays into both nostrils daily., Disp: 16 g, Rfl: 6 .  lamoTRIgine (LAMICTAL) 200 MG tablet, Take 1 tablet (200 mg total) by mouth daily., Disp: 90 tablet, Rfl: 0 .  levonorgestrel-ethinyl estradiol (LARISSIA) 0.1-20 MG-MCG tablet, Take 1 tablet by mouth daily., Disp: , Rfl:  .  LORazepam (ATIVAN) 0.5 MG tablet, Take by mouth., Disp: , Rfl:  .  Melatonin 3 MG TABS, Take by mouth as needed. , Disp: , Rfl:  .  propranolol (INDERAL) 10 MG tablet, Take 10 mg by mouth daily as needed. , Disp: , Rfl:  .  sertraline (ZOLOFT) 25 MG tablet, Take 25 mg  by mouth daily. , Disp: , Rfl:  .  doxycycline (VIBRA-TABS) 100 MG tablet, Take 1 tablet (100 mg total) by mouth 2 (two) times daily. (Patient not taking: Reported on 05/21/2019), Disp: 20 tablet, Rfl: 0  Allergies  Allergen Reactions  . Penicillins   . Zithromax [Azithromycin]     I personally reviewed active problem list, medication list, allergies, notes from last encounter, lab results with the patient/caregiver today.  ROS  Ten systems reviewed and is negative except as mentioned in HPI  Objective  Virtual encounter, vitals not obtained.  There is no height or  weight on file to calculate BMI.  Nursing Note and Vital Signs reviewed.  Physical Exam  Pulmonary/Chest: Effort normal. No respiratory distress. Speaking in complete sentences Neurological: Pt is alert and oriented to person, place, and time. Speech is normal Psychiatric: Patient has a normal mood and affect. behavior is normal. Judgment and thought content normal.  No results found for this or any previous visit (from the past 72 hour(s)).  Assessment & Plan 1. Migraine without aura and without status migrainosus, not intractable - Trial of Triptan PRN, continue lamictal per psychiatry.  Has tried CGRP class without success.  Would like referral to neurology, this is provided today.  Reivewed signs and symptoms of stroke and/or complex migraine in detail and when to go for Emergency Care - rizatriptan (MAXALT) 5 MG tablet; Take 1 tablet (5 mg total) by mouth as needed for migraine. May repeat in 2 hours if needed  Dispense: 10 tablet; Refill: 1 - Ambulatory referral to Neurology   -Red flags and when to present for emergency care or RTC including fever >101.50F, chest pain, shortness of breath, new/worsening/un-resolving symptoms, reviewed with patient at time of visit. Follow up and care instructions discussed and provided in AVS. - I discussed the assessment and treatment plan with the patient. The patient was provided an opportunity to ask questions and all were answered. The patient agreed with the plan and demonstrated an understanding of the instructions.  - The patient was advised to call back or seek an in-person evaluation if the symptoms worsen or if the condition fails to improve as anticipated.  I provided 18 minutes of non-face-to-face time during this encounter.  Hubbard Hartshorn, FNP

## 2019-06-25 ENCOUNTER — Other Ambulatory Visit: Payer: Self-pay

## 2019-06-25 ENCOUNTER — Ambulatory Visit (INDEPENDENT_AMBULATORY_CARE_PROVIDER_SITE_OTHER): Payer: BC Managed Care – PPO | Admitting: Neurology

## 2019-06-25 ENCOUNTER — Encounter: Payer: Self-pay | Admitting: Neurology

## 2019-06-25 VITALS — BP 117/70 | HR 96 | Temp 97.1°F | Ht 59.0 in | Wt 120.0 lb

## 2019-06-25 DIAGNOSIS — G43511 Persistent migraine aura without cerebral infarction, intractable, with status migrainosus: Secondary | ICD-10-CM | POA: Diagnosis not present

## 2019-06-25 DIAGNOSIS — R29818 Other symptoms and signs involving the nervous system: Secondary | ICD-10-CM | POA: Diagnosis not present

## 2019-06-25 DIAGNOSIS — G43E09 Chronic migraine with aura, not intractable, without status migrainosus: Secondary | ICD-10-CM | POA: Insufficient documentation

## 2019-06-25 DIAGNOSIS — R519 Headache, unspecified: Secondary | ICD-10-CM | POA: Diagnosis not present

## 2019-06-25 DIAGNOSIS — G43109 Migraine with aura, not intractable, without status migrainosus: Secondary | ICD-10-CM | POA: Insufficient documentation

## 2019-06-25 DIAGNOSIS — R292 Abnormal reflex: Secondary | ICD-10-CM | POA: Insufficient documentation

## 2019-06-25 DIAGNOSIS — G8929 Other chronic pain: Secondary | ICD-10-CM

## 2019-06-25 MED ORDER — UBRELVY 100 MG PO TABS: 100.0000 mg | ORAL_TABLET | ORAL | 0 refills | Status: DC | PRN

## 2019-06-25 NOTE — Patient Instructions (Signed)
Form - Headache Record There are many types and causes of headaches. A headache record can help guide your treatment plan. Use this form to record the details. Bring this form with you to your follow-up visits. Follow your health care provider's instructions on how to describe your headache. You may be asked to:  Use a pain scale. This is a tool to rate the intensity of your headache using words or numbers.  Describe what your headache feels like, such as dull, achy, throbbing, or sharp. Headache record Date: _______________ Time (from start to end): ____________________ Location of the headache: _________________________  Intensity of the headache: ____________________ Description of the headache: ______________________________________________________________  Hours of sleep the night before the headache: __________  Food or drinks before the headache started: ______________________________________________________________________________________  Events before the headache started: _______________________________________________________________________________________________  Symptoms before the headache started: __________________________________________________________________________________________  Symptoms during the headache: __________________________________________________________________________________________________  Treatment: ________________________________________________________________________________________________________________  Effect of treatment: _________________________________________________________________________________________________________  Other comments: ___________________________________________________________________________________________________________ Date: _______________ Time (from start to end): ____________________ Location of the headache: _________________________  Intensity of the headache: ____________________ Description of the  headache: ______________________________________________________________  Hours of sleep the night before the headache: __________  Food or drinks before the headache started: ______________________________________________________________________________________  Events before the headache started: ____________________________________________________________________________________________  Symptoms before the headache started: _________________________________________________________________________________________  Symptoms during the headache: _______________________________________________________________________________________________  Treatment: ________________________________________________________________________________________________________________  Effect of treatment: _________________________________________________________________________________________________________  Other comments: ___________________________________________________________________________________________________________ Date: _______________ Time (from start to end): ____________________ Location of the headache: _________________________  Intensity of the headache: ____________________ Description of the headache: ______________________________________________________________  Hours of sleep the night before the headache: __________  Food or drinks before the headache started: ______________________________________________________________________________________  Events before the headache started: ____________________________________________________________________________________________  Symptoms before the headache started: _________________________________________________________________________________________  Symptoms during the headache: _______________________________________________________________________________________________  Treatment:  ________________________________________________________________________________________________________________  Effect of treatment: _________________________________________________________________________________________________________  Other comments: ___________________________________________________________________________________________________________ Date: _______________ Time (from start to end): ____________________ Location of the headache: _________________________  Intensity of the headache: ____________________ Description of the headache: ______________________________________________________________  Hours of sleep the night before the headache: _________  Food or drinks before the headache started: ______________________________________________________________________________________  Events before the headache started: ____________________________________________________________________________________________  Symptoms before the headache started: _________________________________________________________________________________________  Symptoms during the headache: _______________________________________________________________________________________________  Treatment: ________________________________________________________________________________________________________________  Effect of treatment: _________________________________________________________________________________________________________  Other comments: ___________________________________________________________________________________________________________ Date: _______________ Time (from start to end): ____________________ Location of the headache: _________________________  Intensity of the headache: ____________________ Description of the headache: ______________________________________________________________  Hours of sleep the night before the headache: _________  Food or drinks  before the headache started: ______________________________________________________________________________________  Events before the headache started: ____________________________________________________________________________________________  Symptoms before the headache started: _________________________________________________________________________________________  Symptoms during the headache: _______________________________________________________________________________________________  Treatment: ________________________________________________________________________________________________________________  Effect of treatment: _________________________________________________________________________________________________________  Other comments: ___________________________________________________________________________________________________________ This information is not intended to replace advice given to you by your health care provider. Make sure you discuss any questions you have with your health care provider. Document Revised: 06/22/2018 Document Reviewed: 06/22/2018 [employer] Patient Education  2020 [employer]  

## 2019-06-25 NOTE — Progress Notes (Signed)
Provider:  Larey Seat, M D  Referring Provider: Arnetha Courser, MD Primary Care Physician:  Arnetha Courser, MD  Chief Complaint  Patient presents with   New Patient (Initial Visit)   RN pt alone, rm 10. pt states that she has suffered wih migraines since middle school. in the last 1.36yr they have become more frequently and in cycles. she has tried and failed Aimovig, Emgality, topiramate, herbal remedies, fioricet.currently taking propranolol for anxiety. and maxalt for emergency migraines   Other    pt has seen Eye MD, ENT and [facility] and they have not been attributed to do with anything in those areas. she saw a dentit who suggested a mouth guard due to pt having grinding and jaw clenching.     HPI:  Elizabeth Velez is a 24 y.o. female and is seen here upon referral from  Raelyn Ensign, NP  for an evaluation of migraine.   I have the pleasure of seeing DALIS BEERS today at a 24 year old Caucasian right-handed female for whom I had the pleasure to perform prior polysomnography in March 2016 almost 5 years ago.  At the time she did not have any evidence of sleep apnea, she had mild occasional snoring no organic sleep disorder was identified.  She did however have a referral because of the underlying complaint of hypersomnia.  It was later out that the patient who suffered from depression suffered from bipolar 1 depression and she has been on mood stabilizers since, and has had no problems with alternating manic or depressive episodes.  The patient has comes now been seen here also for concern for migraine headaches.  Her migraine increase this by days per month.  She will have a consistent headaches for most days of each month she may take Excedrin or Fioricet, she has tried Aimovig and Emgality in the past without the relief of headaches she is also continuously taking Lamictal 200 mg for above-named reasons.  Ear nose and throat specialist have told her that she has a nasal  septal deviation and she was started on nasal spray she was also told to see neurology for the headaches.  She is taking about 4 tablets of Excedrin per week which is high, but she has a much healthier diet, she has reduced caffeine to only 1 cup a day, she keeps a consistent meal and sleep times.  Her past medical diagnoses include irritable bowel, ventral hernia, history of rectal bleeding, muscle cramps and joint crepitus, Raynaud's, night sweats, bipolar 1 disorder.  I also reviewed the patient's medications which include doxycycline, which can cause migraines.  We discussed to limit OTC , and instead use Maxalt.   headaches are coming on quickly 9 out of 10 times, a sharp pain behind boy eyes simultaneously, and remains limited to the temples , rarely into the occipital region.  Photophobia, phonophobia, and  visual aura manifesting as : " lines swim"  -As if looking at a pavement in hot weather and render her unable to read. Visual aura. Nauseated, most times.    Review of Systems: Out of a complete 14 system review, the patient complains of only the following symptoms, and all other reviewed systems are negative.  See above .    Social History   Socioeconomic History   Marital status: Significant Other    Spouse name: Not on file   Number of children: 0   Years of education: 17   Highest education level:  Bachelor's degree (e.g., BA, AB, BS)  Occupational History   Occupation: 1st Grade Teacher  Tobacco Use   Smoking status: Never Smoker   Smokeless tobacco: Never Used  Substance and Sexual Activity   Alcohol use: No    Alcohol/week: 0.0 standard drinks    Comment: only once a month   Drug use: No   Sexual activity: Yes    Partners: Male    Birth control/protection: Condom, Pill  Other Topics Concern   Not on file  Social History Narrative   Caffeine, sporadic   Social Determinants of Health   Financial Resource Strain:    Difficulty of Paying Living  Expenses: Not on file  Food Insecurity:    Worried About Programme researcher, broadcasting/film/video in the Last Year: Not on file   [employer] of Food in the Last Year: Not on file  Transportation Needs:    Lack of Transportation (Medical): Not on file   Lack of Transportation (Non-Medical): Not on file  Physical Activity:    Days of Exercise per Week: Not on file   Minutes of Exercise per Session: Not on file  Stress:    Feeling of Stress : Not on file  Social Connections:    Frequency of Communication with Friends and Family: Not on file   Frequency of Social Gatherings with Friends and Family: Not on file   Attends Religious Services: Not on file   Active Member of Clubs or Organizations: Not on file   Attends Banker Meetings: Not on file   Marital Status: Not on file  Intimate Partner Violence:    Fear of Current or Ex-Partner: Not on file   Emotionally Abused: Not on file   Physically Abused: Not on file   Sexually Abused: Not on file    Family History  Problem Relation Age of Onset   Alzheimer's disease Maternal Grandmother    Heart disease Maternal Grandfather    Alcohol abuse Paternal Aunt    Depression Maternal Aunt     Past Medical History:  Diagnosis Date   Anxiety    Bipolar 1 disorder, depressed (HCC)    Depression    Depression 08/02/2014   Excessive daytime sleepiness 08/02/2014   Hypersomnia with long sleep time, 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 16 g 6   lamoTRIgine (LAMICTAL) 200 MG tablet Take 1 tablet (200 mg total) by mouth daily. 90 tablet 0   levonorgestrel-ethinyl estradiol (LARISSIA) 0.1-20  MG-MCG tablet Take 1 tablet by mouth daily.     propranolol (INDERAL) 10 MG tablet Take 10 mg by mouth daily as needed.      QUEtiapine (SEROQUEL) 50 MG tablet quetiapine 50 mg tablet   1 tablet every day by oral route.     rizatriptan (MAXALT) 5 MG tablet Take 1 tablet (5 mg total) by mouth as needed for migraine. May repeat in 2 hours if needed 10 tablet 1   sertraline (ZOLOFT) 25 MG tablet Take 25 mg by mouth daily.      No current facility-administered medications for this visit.    Allergies as of 06/25/2019 - Review Complete 06/25/2019  Allergen Reaction Noted  Penicillins  08/02/2014   Zithromax [azithromycin]  08/02/2014    Vitals: BP 117/70    Pulse 96    Temp (!) 97.1 F (36.2 C)    Ht 4\' 11"  (1.499 m)    Wt 120 lb (54.4 kg)    BMI 24.24 kg/m  Last Weight:  Wt Readings from Last 1 Encounters:  06/25/19 120 lb (54.4 kg)   Last Height:   Ht Readings from Last 1 Encounters:  06/25/19 4\' 11"  (1.499 m)    Physical exam:  General: The patient is awake, alert and appears not in acute distress. The patient is well groomed. Head: Normocephalic, atraumatic. Neck is supple.  Cardiovascular:  Regular rate and rhythm, without  murmurs or carotid bruit, and without distended neck veins. Respiratory: Lungs are clear to auscultation. Skin:  Without evidence of edema, or rash Trunk:  normal posture.  Neurologic exam : The patient is awake and alert, oriented to place and time.  Memory subjective  described as intact. There is a normal attention span & concentration ability. Speech is fluent without dysarthria, dysphonia or aphasia. Mood and affect are appropriate.  Cranial nerves: no loss of smell and / or of taste.  Pupils are round , but right pupil is about 2 mm smaller than the left. and briskly reactive to light. Funduscopic exam deferred.  Extraocular movements  in vertical and horizontal planes intact and without nystagmus.  Visual fields by finger perimetry are  intact. Hearing to finger rub intact.  Facial sensation intact to fine touch. Facial motor strength is symmetric and tongue and uvula move midline. Tongue protrusion into either cheek is normal. Shoulder shrug is normal.   Motor exam:   Normal tone ,muscle bulk and symmetric  strength in all extremities. Sensory:  Fine touch, pinprick and vibration were tested in all extremities. Proprioception was normal. Coordination: Rapid alternating movements in the fingers/hands were normal. Finger-to-nose maneuver  normal without evidence of ataxia, dysmetria or tremor. Gait and station: Patient walks without assistive device and is able unassisted to climb up to the exam table. Strength within normal limits. Stance is stable and normal.  Deep tendon reflexes: in the upper and lower extremities are symmetric and very brisk - hyperreflexia !! Babinski maneuver response is  downgoing.    Assessment:  After physical and neurologic examination, review of laboratory studies, imaging, neurophysiology testing and pre-existing records, assessment is that of :   Migrainous headaches with auro, with photophobia, phonophobia and with nausea. Frequency 5-8 month   Latent headaches , non-migrainous , for more than 12 days a month.   Plan:  Treatment plan and additional workup :   Non responsive to Emgality, Aimovig.  Next drug would be Ubrelqui by mouth  needs all 10 maxalt tab each month.   Hyperreflexia- needs neck spine CT/ MRI and Brain CT/ MRI . Migraines developed over the last year to higher frequency  Rv with NP or me in 2-3 month.   08/23/19 Leanna Hamid MD 06/25/2019

## 2019-06-26 LAB — COMPREHENSIVE METABOLIC PANEL
ALT: 12 IU/L (ref 0–32)
AST: 21 IU/L (ref 0–40)
Albumin/Globulin Ratio: 1.9 (ref 1.2–2.2)
Albumin: 4.1 g/dL (ref 3.9–5.0)
Alkaline Phosphatase: 43 IU/L (ref 39–117)
BUN/Creatinine Ratio: 14 (ref 9–23)
BUN: 10 mg/dL (ref 6–20)
Bilirubin Total: 0.3 mg/dL (ref 0.0–1.2)
CO2: 24 mmol/L (ref 20–29)
Calcium: 9.2 mg/dL (ref 8.7–10.2)
Chloride: 103 mmol/L (ref 96–106)
Creatinine, Ser: 0.72 mg/dL (ref 0.57–1.00)
GFR calc Af Amer: 135 mL/min/{1.73_m2} (ref >59)
GFR calc non Af Amer: 117 mL/min/{1.73_m2} (ref >59)
Globulin, Total: 2.2 g/dL (ref 1.5–4.5)
Glucose: 83 mg/dL (ref 65–99)
Potassium: 4.6 mmol/L (ref 3.5–5.2)
Sodium: 138 mmol/L (ref 134–144)
Total Protein: 6.3 g/dL (ref 6.0–8.5)

## 2019-06-28 ENCOUNTER — Encounter: Payer: Self-pay | Admitting: Neurology

## 2019-06-28 ENCOUNTER — Telehealth: Payer: Self-pay | Admitting: Neurology

## 2019-06-28 NOTE — Progress Notes (Signed)
Normal result

## 2019-06-28 NOTE — Telephone Encounter (Signed)
PA completed on cover my meds.  KGS:[id number]

## 2019-06-29 NOTE — Telephone Encounter (Signed)
PA approved through [facility] for the patient Effective from 06/28/2019 through 09/19/2019

## 2019-07-14 ENCOUNTER — Ambulatory Visit: Payer: BC Managed Care – PPO

## 2019-07-14 DIAGNOSIS — R252 Cramp and spasm: Secondary | ICD-10-CM

## 2019-07-14 DIAGNOSIS — R292 Abnormal reflex: Secondary | ICD-10-CM

## 2019-07-14 DIAGNOSIS — R29818 Other symptoms and signs involving the nervous system: Secondary | ICD-10-CM

## 2019-07-14 DIAGNOSIS — I7300 Raynaud's syndrome without gangrene: Secondary | ICD-10-CM

## 2019-07-14 DIAGNOSIS — R011 Cardiac murmur, unspecified: Secondary | ICD-10-CM

## 2019-07-14 MED ORDER — GADOBENATE DIMEGLUMINE 529 MG/ML IV SOLN
10.0000 mL | Freq: Once | INTRAVENOUS | Status: AC | PRN
  Administered 2019-07-14: 18:00:00 10 mL via INTRAVENOUS

## 2019-07-15 NOTE — Progress Notes (Signed)
The cervical vertebrae demonstrate normal alignment, body height and bone marrow signal characteristics.  The intervertebral discs show normal appearance without any disc bulging, herniation significant root of foraminal encroachment.  The spinal cord parenchyma shows dilatation of the central canal at C7-T1 which may be from the syrinx or dilated central canal but this is incompletely visualized in the lower portion.  No other parenchymal abnormalities noted in the spinal cord.  Paraspinal soft tissue appear unremarkable.  Visualized portion of the lower brainstem and craniovertebral junction show no abnormalities.  Postcontrast images do not result in abnormal areas of enhancement. The finding in the MRI of the neck explains the brisk reflexes.   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  Recommend MRI scan of thoracic spine for more detailed evaluation.  No other significant abnormalities.  Cc Dr Sherie Don, referring MD.

## 2019-07-15 NOTE — Progress Notes (Signed)
Normal brain MRI, but abnormal cervical spinal cord MRI- we will order an additional thoracic cord MRI to evaluate further.

## 2019-07-20 ENCOUNTER — Telehealth: Payer: Self-pay | Admitting: Neurology

## 2019-07-20 ENCOUNTER — Encounter: Payer: Self-pay | Admitting: Neurology

## 2019-07-20 NOTE — Telephone Encounter (Signed)
We can hold out for thoracic mri, I doubt that the headaches are  related . The neck spinal cord finding is that of a syrinx, and can be developmental / congenital or posttraumatic.

## 2019-07-20 NOTE — Telephone Encounter (Signed)
Called the patient and reviewed the MRI scans. Advised the MRI of the brain was normal and the MD saw no concerns. Advised the cervical spine was abnormal as there was a area at the C7-T1 area that was concerning. Informed her that we were unable to completely evaluate and the Dr Vickey Huger was recommending a MRI of thoracic spine. Advised the patient she would like to order MRI of thoracic spine to evaluate further. Pt verbalized understanding.  Pt states she is unsure if she can afford another MRI to be completed and wanted the MD's thoughts on if she feels that this is something that she should really do urgently at this time. If its pressing she voiced she would do it but if its something that can hold off until she has paid off the others she would prefer to hold off. Her main issue is migraines which she is suffered with and that is what lead to the images being completed. She didn't describe any concerns or pain or numbness or tingling in extremities.  I advised the patient I would defer this question to the MD and reach out to her with what she what her thoughts are. She was appreciative and verbalized understanding.

## 2019-07-20 NOTE — Telephone Encounter (Signed)
-----   Message from Melvyn Novas, MD sent at 07/15/2019  5:13 PM EST ----- The cervical vertebrae demonstrate normal alignment, body height and bone marrow signal characteristics.  The intervertebral discs show normal appearance without any disc bulging, herniation significant root of foraminal encroachment.  The spinal cord parenchyma shows dilatation of the central canal at C7-T1 which may be from the syrinx or dilated central canal but this is incompletely visualized in the lower portion.  No other parenchymal abnormalities noted in the spinal cord.  Paraspinal soft tissue appear unremarkable.  Visualized portion of the lower brainstem and craniovertebral junction show no abnormalities.  Postcontrast images do not result in abnormal areas of enhancement. The finding in the MRI of the neck explains the brisk reflexes.   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  Recommend MRI scan of thoracic spine for more detailed evaluation.  No other significant abnormalities.  Cc Dr Sherie Don, referring MD.

## 2019-09-23 ENCOUNTER — Encounter: Payer: Self-pay | Admitting: Family Medicine

## 2019-09-23 ENCOUNTER — Ambulatory Visit: Payer: BC Managed Care – PPO | Admitting: Family Medicine

## 2019-09-23 ENCOUNTER — Other Ambulatory Visit: Payer: Self-pay

## 2019-09-23 VITALS — BP 114/71 | HR 101 | Temp 97.4°F | Ht 59.0 in | Wt 123.2 lb

## 2019-09-23 DIAGNOSIS — R937 Abnormal findings on diagnostic imaging of other parts of musculoskeletal system: Secondary | ICD-10-CM | POA: Diagnosis not present

## 2019-09-23 DIAGNOSIS — G43109 Migraine with aura, not intractable, without status migrainosus: Secondary | ICD-10-CM

## 2019-09-23 MED ORDER — PROPRANOLOL HCL 10 MG PO TABS: 10.0000 mg | ORAL_TABLET | Freq: Every day | ORAL | 3 refills | Status: DC

## 2019-09-23 NOTE — Patient Instructions (Addendum)
Let's try taking propranolol 10mg  daily for migraine prevention. We can increase dose if well tolerated or we can resume as needed therapy if not well tolerated. Continue Ubrelvy for abortive therapy. May use Maxalt if Ubrelvy not effective but avoid regular use of either medication. I recommend using these less than 10 times per month.   Migraine with aura: There is increased risk for stroke in women with migraine with aura and a contraindication for the combined contraceptive pill for use by women who have migraine with aura. The risk for women with migraine without aura is lower. However other risk factors like smoking are far more likely to increase stroke risk than migraine. There is a recommendation for no smoking and for the use of OCPs without estrogen such as progestogen only pills particularly for women with migraine with aura.Marland Kitchen People who have migraine headaches with auras may be 3 times more likely to have a stroke caused by a blood clot, compared to migraine patients who don't see auras. Women who take hormone-replacement therapy may be 30 percent more likely to suffer a clot-based stroke than women not taking medication containing estrogen. Other risk factors like smoking and high blood pressure may be  much more important.   Magnesium: may help with headaches are aura, the best evidence for magnesium is for migraine with aura is its thought to stop the cortical spreading depression we believe is the pathophysiology of migraine aura.  To prevent or relieve headaches, try the following:  Cool Compress. Lie down and place a cool compress on your head.   Avoid headache triggers. If certain foods or odors seem to have triggered your migraines in the past, avoid them. A headache diary might help you identify triggers.   Include physical activity in your daily routine. Try a daily walk or other moderate aerobic exercise.   Manage stress. Find healthy ways to cope with the stressors, such as  delegating tasks on your to-do list.   Practice relaxation techniques. Try deep breathing, yoga, massage and visualization.   Eat regularly. Eating regularly scheduled meals and maintaining a healthy diet might help prevent headaches. Also, drink plenty of fluids.   Follow a regular sleep schedule. Sleep deprivation might contribute to headaches  Consider biofeedback. With this mind-body technique, you learn to control certain bodily functions -- such as muscle tension, heart rate and blood pressure -- to prevent headaches or reduce headache pain.      Proceed to emergency room if you experience new or worsening symptoms or symptoms do not resolve, if you have new neurologic symptoms or if headache is severe, or for any concerning symptom.   Stay well hydrated. Work on healthy lifestyle habits with healthy, well balanced diet and regular exercise.   Consider Thoracic MRI for better evaluation of cervical dialation  Follow up in 6 months   Migraine Headache A migraine headache is a very strong throbbing pain on one side or both sides of your head. This type of headache can also cause other symptoms. It can last from 4 hours to 3 days. Talk with your doctor about what things may bring on (trigger) this condition. What are the causes? The exact cause of this condition is not known. This condition may be triggered or caused by:  Drinking alcohol.  Smoking.  Taking medicines, such as: ? Medicine used to treat chest pain (nitroglycerin). ? Birth control pills. ? Estrogen. ? Some blood pressure medicines.  Eating or drinking certain products.  Doing physical  activity. Other things that may trigger a migraine headache include:  Having a menstrual period.  Pregnancy.  Hunger.  Stress.  Not getting enough sleep or getting too much sleep.  Weather changes.  Tiredness (fatigue). What increases the risk?  Being 27-71 years old.  Being female.  Having a family history of  migraine headaches.  Being Caucasian.  Having depression or anxiety.  Being very overweight. What are the signs or symptoms?  A throbbing pain. This pain may: ? Happen in any area of the head, such as on one side or both sides. ? Make it hard to do daily activities. ? Get worse with physical activity. ? Get worse around bright lights or loud noises.  Other symptoms may include: ? Feeling sick to your stomach (nauseous). ? Vomiting. ? Dizziness. ? Being sensitive to bright lights, loud noises, or smells.  Before you get a migraine headache, you may get warning signs (an aura). An aura may include: ? Seeing flashing lights or having blind spots. ? Seeing bright spots, halos, or zigzag lines. ? Having tunnel vision or blurred vision. ? Having numbness or a tingling feeling. ? Having trouble talking. ? Having weak muscles.  Some people have symptoms after a migraine headache (postdromal phase), such as: ? Tiredness. ? Trouble thinking (concentrating). How is this treated?  Taking medicines that: ? Relieve pain. ? Relieve the feeling of being sick to your stomach. ? Prevent migraine headaches.  Treatment may also include: ? Having acupuncture. ? Avoiding foods that bring on migraine headaches. ? Learning ways to control your body functions (biofeedback). ? Therapy to help you know and deal with negative thoughts (cognitive behavioral therapy). Follow these instructions at home: Medicines  Take over-the-counter and prescription medicines only as told by your doctor.  Ask your doctor if the medicine prescribed to you: ? Requires you to avoid driving or using heavy machinery. ? Can cause trouble pooping (constipation). You may need to take these steps to prevent or treat trouble pooping:  Drink enough fluid to keep your pee (urine) pale yellow.  Take over-the-counter or prescription medicines.  Eat foods that are high in fiber. These include beans, whole grains, and  fresh fruits and vegetables.  Limit foods that are high in fat and sugar. These include fried or sweet foods. Lifestyle  Do not drink alcohol.  Do not use any products that contain nicotine or tobacco, such as cigarettes, e-cigarettes, and chewing tobacco. If you need help quitting, ask your doctor.  Get at least 8 hours of sleep every night.  Limit and deal with stress. General instructions      Keep a journal to find out what may bring on your migraine headaches. For example, write down: ? What you eat and drink. ? How much sleep you get. ? Any change in what you eat or drink. ? Any change in your medicines.  If you have a migraine headache: ? Avoid things that make your symptoms worse, such as bright lights. ? It may help to lie down in a dark, quiet room. ? Do not drive or use heavy machinery. ? Ask your doctor what activities are safe for you.  Keep all follow-up visits as told by your doctor. This is important. Contact a doctor if:  You get a migraine headache that is different or worse than others you have had.  You have more than 15 headache days in one month. Get help right away if:  Your migraine headache gets very bad.  Your migraine headache lasts longer than 72 hours.  You have a fever.  You have a stiff neck.  You have trouble seeing.  Your muscles feel weak or like you cannot control them.  You start to lose your balance a lot.  You start to have trouble walking.  You pass out (faint).  You have a seizure. Summary  A migraine headache is a very strong throbbing pain on one side or both sides of your head. These headaches can also cause other symptoms.  This condition may be treated with medicines and changes to your lifestyle.  Keep a journal to find out what may bring on your migraine headaches.  Contact a doctor if you get a migraine headache that is different or worse than others you have had.  Contact your doctor if you have more  than 15 headache days in a month. This information is not intended to replace advice given to you by your health care provider. Make sure you discuss any questions you have with your health care provider. Document Revised: 09/25/2018 Document Reviewed: 07/16/2018 Elsevier Patient Education  2020 [employer].

## 2019-09-23 NOTE — Progress Notes (Signed)
PATIENT: Elizabeth Velez DOB: May 10, 1996  REASON FOR VISIT: follow up HISTORY FROM: patient  Chief Complaint  Patient presents with  . Migraine    rm 6  "doing so so with headaches/migraines, rescue meds are working but I try to push through without taking them"     HISTORY OF PRESENT ILLNESS: Today 09/23/19 Elizabeth Velez is a 24 y.o. female here today for follow up for migraines with aura. MRI brain was normal. 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 Thoracic spine MRI recommended but patient has opted to hold of for now due to cost. She does endorse multiple back injuries in the past relating to horseback riding.    She continues to have regular migraines. Aura symptoms occur occasionally. Bernita Raisin does seem to abort headahce. She is trying to avoid taking medication unless she has to. She will try to take a nap and use complementary therapy when she idea. She usually takes 4-5 Ubrelvy tablets each month. She is taking propranolol only as needed for anxiety. She tolerates it well. She continues lamotrigine, quetiapine and sertraline for mood stabilization.  She is on combination OCP for birth control. She has discussed migraines with aura with her OB/GYN provider. She is aware of increased risk of stroke but feels that benefit with BC outweigh risks at this time.    HISTORY: (copied from Dr Dohmeier's note on 06/25/2019)  HPI:  Elizabeth Velez is a 24 y.o. female and is seen here upon referral from  Maurice Small, NP  for an evaluation of migraine.   I have the pleasure of seeing Elizabeth Velez today at a 24 year old Caucasian right-handed female for whom I had the pleasure to perform prior polysomnography in March 2016 almost 5 years ago.  At the time she did not have any evidence of sleep apnea, she  had mild occasional snoring no organic sleep disorder was identified.  She did however have a referral because of the underlying complaint of hypersomnia.  It was later out that the patient who suffered from depression suffered from bipolar 1 depression and she has been on mood stabilizers since, and has had no problems with alternating manic or depressive episodes.  The patient has comes now been seen here also for concern for migraine headaches.  Her migraine increase this by days per month.  She will have a consistent headaches for most days of each month she may take Excedrin or Fioricet, she has tried Aimovig and Emgality in the past without the relief of headaches she is also continuously taking Lamictal 200 mg for above-named reasons.  Ear nose and throat specialist have told her that she has a nasal septal deviation and she was started on nasal spray she was also told to see neurology for the headaches.  She is taking about 4 tablets of Excedrin per week which is high, but she has a much healthier diet, she has reduced caffeine to only 1 cup a day, she keeps a consistent meal and sleep times.  Her past medical diagnoses include irritable bowel, ventral hernia, history of rectal bleeding, muscle cramps and joint crepitus, Raynaud's, night sweats, bipolar 1 disorder.  I also reviewed the patient's medications which include doxycycline, which can cause migraines.  We discussed to limit OTC , and instead use Maxalt.   headaches  are coming on quickly 9 out of 10 times, a sharp pain behind boy eyes simultaneously, and remains limited to the temples , rarely into the occipital region.  Photophobia, phonophobia, and  visual aura manifesting as : " lines swim"  -As if looking at a pavement in hot weather and render her unable to read. Visual aura. Nauseated, most times.    REVIEW OF SYSTEMS: Out of a complete 14 system review of symptoms, the patient complains only of the following symptoms, headaches,  anxiety, depression and all other reviewed systems are negative.   ALLERGIES: Allergies  Allergen Reactions  . Penicillins   . Zithromax [Azithromycin]     HOME MEDICATIONS: Outpatient Medications Prior to Visit  Medication Sig Dispense Refill  . fluticasone (FLONASE) 50 MCG/ACT nasal spray Place 2 sprays into both nostrils daily. 16 g 6  . lamoTRIgine (LAMICTAL) 200 MG tablet Take 1 tablet (200 mg total) by mouth daily. 90 tablet 0  . levonorgestrel-ethinyl estradiol (LARISSIA) 0.1-20 MG-MCG tablet Take 1 tablet by mouth daily.    . QUEtiapine (SEROQUEL) 50 MG tablet quetiapine 50 mg tablet   1 tablet every day by oral route.    . rizatriptan (MAXALT) 5 MG tablet Take 1 tablet (5 mg total) by mouth as needed for migraine. May repeat in 2 hours if needed 10 tablet 1  . sertraline (ZOLOFT) 25 MG tablet Take 25 mg by mouth daily.     Marland Kitchen Ubrogepant (UBRELVY) 100 MG TABS Take 100 mg by mouth as needed. 15 tablet 0  . propranolol (INDERAL) 10 MG tablet Take 10 mg by mouth daily as needed.      No facility-administered medications prior to visit.    PAST MEDICAL HISTORY: Past Medical History:  Diagnosis Date  . Anxiety   . Bipolar 1 disorder, depressed (HCC)   . Depression   . Depression 08/02/2014  . Excessive daytime sleepiness 08/02/2014  . 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 Migraine   . Ventral hernia without obstruction or gangrene 05/13/2018   CT scan Nov 2019    PAST SURGICAL HISTORY: Past Surgical History:  Procedure Laterality Date  . WISDOM TOOTH EXTRACTION Bilateral 11/22/2016    FAMILY HISTORY: Family History  Problem Relation Age of Onset  . Alzheimer's disease Maternal Grandmother   . Heart disease Maternal Grandfather   . Alcohol abuse Paternal Aunt   . Depression Maternal Aunt     SOCIAL HISTORY: Social History   Socioeconomic History  . Marital status: Significant Other    Spouse name: Not on file  . Number of children: 0  .  Years of education: 24  . Highest education level: Bachelor's degree (e.g., BA, AB, BS)  Occupational History  . Occupation: 1st Grade Teacher  Tobacco Use  . Smoking status: Never Smoker  . Smokeless tobacco: Never Used  Substance and Sexual Activity  . Alcohol use: No    Alcohol/week: 0.0 standard drinks    Comment: only once a month  . Drug use: No  . Sexual activity: Yes    Partners: Male    Birth control/protection: Condom, Pill  Other Topics Concern  . Not on file  Social History Narrative   Caffeine, sporadic   Social Determinants of Health   Financial Resource Strain:   . Difficulty of Paying Living Expenses:   Food Insecurity:   . Worried About Programme researcher, broadcasting/film/video in the Last Year:   . [employer] of Food in the Last Year:  Transportation Needs:   . Film/video editor (Medical):   Marland Kitchen Lack of Transportation (Non-Medical):   Physical Activity:   . Days of Exercise per Week:   . Minutes of Exercise per Session:   Stress:   . Feeling of Stress :   Social Connections:   . Frequency of Communication with Friends and Family:   . Frequency of Social Gatherings with Friends and Family:   . Attends Religious Services:   . Active Member of Clubs or Organizations:   . Attends Archivist Meetings:   Marland Kitchen Marital Status:   Intimate Partner Violence:   . Fear of Current or Ex-Partner:   . Emotionally Abused:   Marland Kitchen Physically Abused:   . Sexually Abused:       PHYSICAL EXAM  Vitals:   09/23/19 0900  BP: 114/71  Pulse: (!) 101  Temp: (!) 97.4 F (36.3 C)  Weight: 123 lb 3.2 oz (55.9 kg)  Height: 4\' 11"  (1.499 m)   Body mass index is 24.88 kg/m.  Generalized: Well developed, in no acute distress  Cardiology: normal rate and rhythm, no murmur noted Neurological examination  Mentation: Alert oriented to time, place, history taking. Follows all commands speech and anguage fluent Cranial nerve II-XII: Pupils were equal round reactive to light. Extraocular  movements were full, visual field were full  Motor: The motor testing reveals 5 over 5 strength of all 4 extremities. Good symmetric motor tone is noted throughout.  Sensory: Sensory testing is intact to soft touch on all 4 extremities. No evidence of extinction is noted.  Coordination: Cerebellar testing reveals good finger-nose-finger and heel-to-shin bilaterally.  Gait and station: Gait is normal. Tandem gait is normal. Romberg is negative. No drift is seen.  Reflexes: Deep tendon reflexes are symmetric and brisk bilaterally.   DIAGNOSTIC DATA (LABS, IMAGING, TESTING) - I reviewed patient records, labs, notes, testing and imaging myself where available.  No flowsheet data found.   Lab Results  Component Value Date   WBC 7.3 05/13/2018   HGB 14.4 05/13/2018   HCT 42.2 05/13/2018   MCV 96.1 05/13/2018   PLT 226 05/13/2018      Component Value Date/Time   NA 138 06/25/2019 1155   NA 140 10/10/2013 1548   K 4.6 06/25/2019 1155   K 4.2 10/10/2013 1548   CL 103 06/25/2019 1155   CL 107 10/10/2013 1548   CO2 24 06/25/2019 1155   CO2 29 10/10/2013 1548   GLUCOSE 83 06/25/2019 1155   GLUCOSE 98 05/13/2018 0941   GLUCOSE 91 10/10/2013 1548   BUN 10 06/25/2019 1155   BUN 12 10/10/2013 1548   CREATININE 0.72 06/25/2019 1155   CREATININE 0.84 05/08/2018 1244   CALCIUM 9.2 06/25/2019 1155   CALCIUM 8.8 (L) 10/10/2013 1548   PROT 6.3 06/25/2019 1155   PROT 7.0 10/10/2013 1548   ALBUMIN 4.1 06/25/2019 1155   ALBUMIN 3.5 (L) 10/10/2013 1548   AST 21 06/25/2019 1155   AST 27 (H) 10/10/2013 1548   ALT 12 06/25/2019 1155   ALT 18 10/10/2013 1548   ALKPHOS 43 06/25/2019 1155   ALKPHOS 44 (L) 10/10/2013 1548   BILITOT 0.3 06/25/2019 1155   BILITOT 0.5 10/10/2013 1548   GFRNONAA 117 06/25/2019 1155   GFRNONAA 97 05/08/2018 1244   GFRAA 135 06/25/2019 1155   GFRAA 113 05/08/2018 1244   Lab Results  Component Value Date   CHOL 147 03/29/2015   HDL 55 03/29/2015   LDLCALC 80  03/29/2015   TRIG 61 03/29/2015   CHOLHDL 2.7 03/29/2015   No results found for: HGBA1C No results found for: VITAMINB12 Lab Results  Component Value Date   TSH 1.30 12/08/2015     ASSESSMENT AND PLAN 24 y.o. 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 here with     ICD-10-CM   1. Chronic migraine with aura  G43.109   2. Abnormal MRI, cervical spine  R93.7     Lisabeth continues to have about 10 migrainous headaches each month. She tolerated propranolol as needed for anxiety. We will increase to daily dosing of propranolol. We will continue 10mg  dosing at first to see how she tolerates this for migraine prevention. She will continue Ubrelvy for abortive therapy. She may also use Maxalt if Ubrelvy not effective but was warned against regular use of either medication. We have discussed elevated risk of stroke in women with aura and on birth control. She was advised not to smoke. She will continue discussion with OB/GYN and will consider non estrogen based options for birth control. Signs of stroke reviewed. We have also discussed abnormal MRI results. She has had multiple back injuries from riding horses. She does not feel that she can absorb the cost of another MRI at this time but will consider getting thoracic MRI in the future. She will call with any new or worsening symptoms. Adequate hydration and healthy lifestyle habits advised. She will follow up with me in 6 months, sooner of needed. She verbalizes understanding and agreement with this plan.    No orders of the defined types were placed in this encounter.    Meds ordered this encounter  Medications  . propranolol (INDERAL) 10 MG tablet    Sig: Take 1 tablet (10 mg total) by  mouth daily.    Dispense:  90 tablet    Refill:  3    Order Specific Question:   Supervising Provider    Answer:   Anson Fret      I spent 30 minutes with the patient. 50% of this time was spent counseling and educating patient on plan of care and medications.     [id number], FNP-C 09/23/2019, 9:44 AM [facility] [address] [place], [place] [phone]

## 2019-10-01 ENCOUNTER — Encounter: Payer: Self-pay | Admitting: Family Medicine

## 2020-03-28 ENCOUNTER — Encounter: Payer: Self-pay | Admitting: Family Medicine

## 2020-03-28 ENCOUNTER — Telehealth (INDEPENDENT_AMBULATORY_CARE_PROVIDER_SITE_OTHER): Payer: BC Managed Care – PPO | Admitting: Family Medicine

## 2020-03-28 DIAGNOSIS — G43109 Migraine with aura, not intractable, without status migrainosus: Secondary | ICD-10-CM

## 2020-03-28 DIAGNOSIS — R937 Abnormal findings on diagnostic imaging of other parts of musculoskeletal system: Secondary | ICD-10-CM | POA: Diagnosis not present

## 2020-03-28 MED ORDER — UBRELVY 100 MG PO TABS: 100.0000 mg | ORAL_TABLET | ORAL | 11 refills | Status: DC | PRN

## 2020-03-28 MED ORDER — PROPRANOLOL HCL 10 MG PO TABS: 10.0000 mg | ORAL_TABLET | Freq: Two times a day (BID) | ORAL | 3 refills | Status: DC

## 2020-03-28 NOTE — Progress Notes (Signed)
PATIENT: Elizabeth Velez DOB: Jan 12, 1996  REASON FOR VISIT: follow up HISTORY FROM: patient  Virtual Visit via Telephone Note  I connected with Elizabeth Velez on 03/28/20 at  9:30 AM EDT by telephone and verified that I am speaking with the correct person using two identifiers.   I discussed the limitations, risks, security and privacy concerns of performing an evaluation and management service by telephone and the availability of in person appointments. I also discussed with the patient that there may be a patient responsible charge related to this service. The patient expressed understanding and agreed to proceed.   History of Present Illness:  03/28/20 Elizabeth Velez is a 24 y.o. female here today for follow up for migraines with aura. She was advised to increase propranolol from PRN use to 10mg  daily as she was hesitant about daily preventatives. Headaches have reduced in frequency and intensity. She may have 2-4 headaches per month with 1-2 being migrainous. She continues Ubrelvy for abortive therapy. She continues sertraline, quetiapine and lamotrigine for mood stabilization.    History (copied form my note on 09/23/2019)  Elizabeth Velez is a 24 y.o. female here today for follow up for migraines with aura. MRI brain was normal. 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 Thoracic spine MRI recommended but patient has opted to hold of for now due to cost. She does endorse multiple back injuries in the past relating to horseback riding.    She continues to have regular migraines. Aura symptoms occur occasionally. Bernita Raisin does seem to abort headahce. She is trying to avoid taking medication unless she has to. She will try to take a nap and use complementary therapy when she idea. She usually takes 4-5  Ubrelvy tablets each month. She is taking propranolol only as needed for anxiety. She tolerates it well. She continues lamotrigine, quetiapine and sertraline for mood stabilization.  She is on combination OCP for birth control. She has discussed migraines with aura with her OB/GYN provider. She is aware of increased risk of stroke but feels that benefit with BC outweigh risks at this time.    HISTORY: (copied from Dr Dohmeier's note on 06/25/2019)  Elizabeth M Gledhillis a 23 y.o.femaleand isseen hereuponreferral from Maurice Small, NPfor an evaluation of migraine.  I have the pleasure of seeing Elizabeth MGledhilltoday at a 24 year old Caucasian right-handed female for whom I had the pleasure to perform prior polysomnography in March 2016 almost 5 years ago. At the time she did not have any evidence of sleep apnea, she had mild occasional snoring no organic sleep disorder was identified. She did however have a referral because of the underlying complaint of hypersomnia. It was later out that the patient who suffered from depression suffered from bipolar 1 depression and she has been on mood stabilizers since, and has had no problems with alternating manic or depressive episodes. The patient has comes now been seen here also for concern for migraine headaches. Her migraine increase this by days per month. She will have a consistent headaches for most days of each month she may take Excedrin or Fioricet, she has tried Aimovig and Emgality in the past without the relief of headaches she is also continuously taking Lamictal 200 mg for above-named reasons. Ear nose and throat specialist have told her that she has a nasal septal  deviation and she was started on nasal spray she was also told to see neurology for the headaches. She is taking about 4 tablets of Excedrin per week which is high, but she has a much healthier diet, she has reduced caffeine to only 1 cup a day, she keeps a  consistent meal and sleep times.  Her past medical diagnoses include irritable bowel, ventral hernia, history of rectal bleeding, muscle cramps and joint crepitus, Raynaud's, night sweats, bipolar 1 disorder. I also reviewed the patient's medications which include doxycycline, which can cause migraines. We discussed to limit OTC , and instead use Maxalt.   headaches are coming on quickly 9 out of 10 times, a sharp pain behind boy eyes simultaneously, and remains limited to the temples , rarely into the occipital region.  Photophobia, phonophobia, and visual aura manifesting as : " lines swim" -As if looking at a pavement in hot weather and render her unable to read. Visual aura. Nauseated, most times.     Observations/Objective:  Generalized: Well developed, in no acute distress  Mentation: Alert oriented to time, place, history taking. Follows all commands speech and language fluent   Assessment and Plan:  24 y.o. 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 here with    ICD-10-CM   1. Chronic migraine with aura  G43.109   2. Abnormal MRI, cervical spine  R93.7    Elizabeth Velez is doing well, today. Headaches have decreased in frequency and intensity since starting propranolol daily. She is interested in increasing dose to 10mg  BID as this has helped so much with headaches and anxiety. She will start 10mg  BID dosing and call me with any concerns. She will continue Ubrelvy for abortive therapy. Healthy lifestyle habits encouraged. She will continue follow up with PCP as directed. May consider thoracic imaging in the future if needed. She will return to see me in 1 year, sooner if needed. She verbalizes understanding and agreement with this  plan.    No orders of the defined types were placed in this encounter.   Meds ordered this encounter  Medications  . propranolol (INDERAL) 10 MG tablet    Sig: Take 1 tablet (10 mg total) by mouth 2 (two) times daily.    Dispense:  180 tablet    Refill:  3    Order Specific Question:   Supervising Provider    Answer:   Anson Fret [id number]  . Ubrogepant (UBRELVY) 100 MG TABS    Sig: Take 100 mg by mouth as needed.    Dispense:  8 tablet    Refill:  11    Order Specific Question:   Supervising Provider    Answer:   Anson Fret [[id number]]     Follow Up Instructions:  I discussed the assessment and treatment plan with the patient. The patient was provided an opportunity to ask questions and all were answered. The patient agreed with the plan and demonstrated an understanding of the instructions.   The patient was advised to call back or seek an in-person evaluation if the symptoms worsen or if the condition fails to improve as anticipated.  I provided 15 minutes of non-face-to-face time during this encounter. Patient is located at her place of residence during Mychart visit. Provider is int he office.    Nathanie Ottley,  NP

## 2020-03-30 ENCOUNTER — Telehealth: Payer: Self-pay | Admitting: *Deleted

## 2020-03-30 NOTE — Telephone Encounter (Signed)
Bernita Raisin PA, key: [id number], [id number]. Your information has been submitted to [facility]. [employer] will review the request and notify you of the determination decision directly, typically within 72 hours of receiving all information. If [employer] [facility] has not responded within the specified timeframe or if you have any questions about your PA submission, contact [employer] [place] directly at [phone]

## 2020-04-03 ENCOUNTER — Encounter: Payer: Self-pay | Admitting: *Deleted

## 2020-04-03 NOTE — Telephone Encounter (Signed)
[employer] Ubrelvy approved 03/30/20 through 03/29/21. Faxed approval to pharmacy, sent patient my chart to advise.

## 2021-04-18 ENCOUNTER — Other Ambulatory Visit: Payer: Self-pay

## 2021-04-18 MED ORDER — PROPRANOLOL HCL 10 MG PO TABS: 10.0000 mg | ORAL_TABLET | Freq: Two times a day (BID) | ORAL | 0 refills | Status: DC

## 2021-05-14 ENCOUNTER — Other Ambulatory Visit: Payer: Self-pay

## 2021-05-14 MED ORDER — PROPRANOLOL HCL 10 MG PO TABS: 10.0000 mg | ORAL_TABLET | Freq: Two times a day (BID) | ORAL | 0 refills | Status: AC

## 2021-05-23 ENCOUNTER — Telehealth: Payer: Self-pay

## 2021-05-23 NOTE — Telephone Encounter (Signed)
LVM for pt to schedule her an appt. She was last seen 05-21-2019 and does not have to Re establish or become a new patient since it has not been 3 years. She just needs to be placed on one of the new providers schedule according to her insurance that they are set up with.

## 2021-05-23 NOTE — Telephone Encounter (Signed)
Copied from CRM [id number]. Topic: Appointment Scheduling - Scheduling Inquiry for Clinic >> May 22, 2021  4:42 PM Aretta Nip wrote: Pt  of Dr Sherie Don last since in 2021/ wants to restablish with one of the new providers Call [phone]

## 2021-06-01 ENCOUNTER — Ambulatory Visit: Payer: Self-pay | Admitting: Nurse Practitioner

## 2021-06-04 ENCOUNTER — Ambulatory Visit: Payer: 59 | Admitting: Physician Assistant

## 2021-06-04 ENCOUNTER — Encounter: Payer: Self-pay | Admitting: Physician Assistant

## 2021-06-04 VITALS — BP 98/60 | HR 97 | Temp 98.2°F | Resp 16 | Ht 59.0 in | Wt 119.0 lb

## 2021-06-04 DIAGNOSIS — Z20822 Contact with and (suspected) exposure to covid-19: Secondary | ICD-10-CM

## 2021-06-04 DIAGNOSIS — Z021 Encounter for pre-employment examination: Secondary | ICD-10-CM

## 2021-06-04 NOTE — Progress Notes (Signed)
Established Patient Office Visit  Subjective:  Patient ID: Elizabeth Velez, female    DOB: May 12, 1996  Age: 25 y.o. MRN: [id number]  CC: No chief complaint on file.   HPI Elizabeth Velez presents for follow up and employment paperwork.  Seeking to re-establish care at clinic and brought employment paperwork to fill out.  Denies acute concerns today,.  Past Medical History:  Diagnosis Date   Anxiety    Bipolar 1 disorder, depressed (HCC)    Depression    Depression 08/02/2014   Excessive daytime sleepiness 08/02/2014   Hypersomnia with long sleep time, idiopathic 08/02/2014   Migraine    Ventral hernia without obstruction or gangrene 05/13/2018   CT scan Nov 2019    Past Surgical History:  Procedure Laterality Date   WISDOM TOOTH EXTRACTION Bilateral 11/22/2016    Family History  Problem Relation Age of Onset   Alzheimer's disease Maternal Grandmother    Heart disease Maternal Grandfather    Alcohol abuse Paternal Aunt    Depression Maternal Aunt     Social History   Socioeconomic History   Marital status: Significant Other    Spouse name: Not on file   Number of children: 0   Years of education: 17   Highest education level: Bachelor's degree (e.g., BA, AB, BS)  Occupational History   Occupation: 1st Grade Teacher  Tobacco Use   Smoking status: Never   Smokeless tobacco: Never  Vaping Use   Vaping Use: Never used  Substance and Sexual Activity   Alcohol use: No    Alcohol/week: 0.0 standard drinks    Comment: only once a month   Drug use: No   Sexual activity: Yes    Partners: Male    Birth control/protection: Condom, Pill  Other Topics Concern   Not on file  Social History Narrative   Caffeine, sporadic   Social Determinants of Health   Financial Resource Strain: Not on file  Food Insecurity: Not on file  Transportation Needs: Not on file  Physical Activity: Not on file  Stress: Not on file  Social Connections: Not on file  Intimate  Partner Violence: Not on file    Outpatient Medications Prior to Visit  Medication Sig Dispense Refill   fluticasone (FLONASE) 50 MCG/ACT nasal spray Place 2 sprays into both nostrils daily. 16 g 6   lamoTRIgine (LAMICTAL) 200 MG tablet Take 1 tablet (200 mg total) by mouth daily. 90 tablet 0   levonorgestrel-ethinyl estradiol (LARISSIA) 0.1-20 MG-MCG tablet Take 1 tablet by mouth daily.     propranolol (INDERAL) 10 MG tablet Take 1 tablet (10 mg total) by mouth 2 (two) times daily. MUST SCHEDULE AN APPT FOR CONTINUED REFILLS. [phone] 30 tablet 0   QUEtiapine (SEROQUEL) 50 MG tablet quetiapine 50 mg tablet   1 tablet every day by oral route.     sertraline (ZOLOFT) 25 MG tablet Take 25 mg by mouth daily.      Ubrogepant (UBRELVY) 100 MG TABS Take 100 mg by mouth as needed. 8 tablet 11   No facility-administered medications prior to visit.    Allergies  Allergen Reactions   Penicillins    Zithromax [Azithromycin]     ROS Review of Systems  Constitutional:  Negative for chills, fatigue and unexpected weight change.  HENT:  Positive for congestion (Hx of deviated septum - chronic). Negative for hearing loss, sinus pressure, sinus pain, sneezing, sore throat, tinnitus and trouble swallowing.   Respiratory:  Negative for cough, shortness  of breath and wheezing.   Cardiovascular:  Negative for chest pain, palpitations and leg swelling.  Gastrointestinal:  Positive for blood in stool (Occasionally, dark red, occuring every few months, no hx of hemorrhoids.) and diarrhea (Hx of IBS-D). Negative for constipation, nausea, rectal pain and vomiting.  Genitourinary:  Negative for dysuria, hematuria and menstrual problem.  Musculoskeletal:  Negative for arthralgias, back pain, joint swelling, myalgias, neck pain and neck stiffness.  Skin:  Negative for rash.  Neurological:  Negative for dizziness, syncope, weakness, light-headedness, numbness and headaches.  Psychiatric/Behavioral:  Negative  for dysphoric mood, sleep disturbance and suicidal ideas. The patient is nervous/anxious.      Objective:    Physical Exam Constitutional:      Appearance: Normal appearance. She is normal weight.  HENT:     Head: Normocephalic.  Eyes:     Extraocular Movements: Extraocular movements intact.     Conjunctiva/sclera: Conjunctivae normal.     Pupils: Pupils are equal, round, and reactive to light.  Cardiovascular:     Rate and Rhythm: Normal rate and regular rhythm.     Pulses: Normal pulses.     Heart sounds: Normal heart sounds. No murmur heard.   No gallop.  Pulmonary:     Effort: Pulmonary effort is normal.     Breath sounds: Normal breath sounds. No stridor. No wheezing, rhonchi or rales.  Abdominal:     General: Abdomen is flat.     Palpations: Abdomen is soft.  Musculoskeletal:     Cervical back: Normal range of motion and neck supple.     Right lower leg: No edema.     Left lower leg: No edema.  Skin:    General: Skin is warm and dry.  Neurological:     General: No focal deficit present.     Mental Status: She is alert and oriented to person, place, and time.  Psychiatric:        Mood and Affect: Mood normal.        Behavior: Behavior normal.        Thought Content: Thought content normal.    There were no vitals taken for this visit. Wt Readings from Last 3 Encounters:  09/23/19 123 lb 3.2 oz (55.9 kg)  06/25/19 120 lb (54.4 kg)  08/24/18 112 lb 1.6 oz (50.8 kg)     Health Maintenance Due  Topic Date Due   Hepatitis C Screening  Never done   PAP-Cervical Cytology Screening  Never done   INFLUENZA VACCINE  01/15/2021   PAP SMEAR-Modifier  03/05/2021    There are no preventive care reminders to display for this patient.  Lab Results  Component Value Date   TSH 1.30 12/08/2015   Lab Results  Component Value Date   WBC 7.3 05/13/2018   HGB 14.4 05/13/2018   HCT 42.2 05/13/2018   MCV 96.1 05/13/2018   PLT 226 05/13/2018   Lab Results  Component  Value Date   NA 138 06/25/2019   K 4.6 06/25/2019   CO2 24 06/25/2019   GLUCOSE 83 06/25/2019   BUN 10 06/25/2019   CREATININE 0.72 06/25/2019   BILITOT 0.3 06/25/2019   ALKPHOS 43 06/25/2019   AST 21 06/25/2019   ALT 12 06/25/2019   PROT 6.3 06/25/2019   ALBUMIN 4.1 06/25/2019   CALCIUM 9.2 06/25/2019   ANIONGAP 10 05/13/2018   Lab Results  Component Value Date   CHOL 147 03/29/2015   Lab Results  Component Value Date  HDL 55 03/29/2015   Lab Results  Component Value Date   LDLCALC 80 03/29/2015   Lab Results  Component Value Date   TRIG 61 03/29/2015   Lab Results  Component Value Date   CHOLHDL 2.7 03/29/2015   No results found for: HGBA1C    Assessment & Plan:   1. Encounter for screening laboratory testing for COVID-19 virus in asymptomatic patient Covid screening provided today for pre-employment paperwork.   2. Physical exam, pre-employment Patient-provided paperwork completed in office  Recommended follow up in 3 months for establishing care and physical.     No orders of the defined types were placed in this encounter.   Follow-up: No follow-ups on file.    Carling Liberman E Latreshia Beauchaine, PA-C

## 2021-06-06 LAB — SARS-COV-2, NAA 2 DAY TAT

## 2021-06-06 LAB — NOVEL CORONAVIRUS, NAA: SARS-CoV-2, NAA: NOT DETECTED

## 2021-06-23 ENCOUNTER — Ambulatory Visit
Admission: EM | Admit: 2021-06-23 | Discharge: 2021-06-23 | Disposition: A | Discharge status: Home / Self Care | Payer: 59 | Attending: Family Medicine | Admitting: Family Medicine

## 2021-06-23 ENCOUNTER — Encounter: Payer: Self-pay | Admitting: Emergency Medicine

## 2021-06-23 DIAGNOSIS — R6889 Other general symptoms and signs: Secondary | ICD-10-CM | POA: Diagnosis not present

## 2021-06-23 DIAGNOSIS — R112 Nausea with vomiting, unspecified: Secondary | ICD-10-CM | POA: Diagnosis not present

## 2021-06-23 LAB — POCT INFLUENZA A/B
Influenza A, POC: NEGATIVE
Influenza B, POC: NEGATIVE

## 2021-06-23 MED ORDER — ONDANSETRON HCL 4 MG/2ML IJ SOLN: 4.0000 mg | Freq: Once | INTRAMUSCULAR | Status: DC

## 2021-06-23 MED ORDER — METOCLOPRAMIDE HCL 5 MG/ML IJ SOLN
5.0000 mg | Freq: Once | INTRAMUSCULAR | Status: AC
  Administered 2021-06-23: 5 mg via INTRAMUSCULAR

## 2021-06-23 MED ORDER — ONDANSETRON 4 MG PO TBDP: 4.0000 mg | ORAL_TABLET | Freq: Three times a day (TID) | ORAL | 0 refills | Status: DC | PRN

## 2021-06-23 NOTE — Discharge Instructions (Signed)
Take Zofran as prescribed. Your COVID/Flu results should result within 3  days. Negative results are immediately resulted to Mychart. Positive results will receive a follow-up call from our clinic. If symptoms are present, I recommend home quarantine until results are known.  Alternate Tylenol and ibuprofen as needed for body aches and fever.  Symptom management per recommendations discussed today.  If any breathing difficulty or chest pain develops go immediately to the closest emergency department for evaluation.

## 2021-06-23 NOTE — ED Triage Notes (Signed)
Pt presents with vomiting and diarrhea that started today.

## 2021-06-24 NOTE — ED Provider Notes (Signed)
[facility]    CSN: [id number] Arrival date & time: 06/23/21  1225      History   Chief Complaint Chief Complaint  Patient presents with   Emesis   Diarrhea    HPI Elizabeth Velez is a 26 y.o. female.   HPI Patient presents today with acute onset vomiting and diarrhea that began today. No abdominal pain, feels extremely sick on stomach. She is febrile on arrival today. She has attempted to eat however vomited food and fluids. No known exposure to anyone positive for COVID or Flu.  Past Medical History:  Diagnosis Date   Anxiety    Bipolar 1 disorder, depressed (HCC)    Depression    Depression 08/02/2014   Excessive daytime sleepiness 08/02/2014   Hypersomnia with long sleep time, idiopathic 08/02/2014   Migraine    Ventral hernia without obstruction or gangrene 05/13/2018   CT scan Nov 2019    Patient Active Problem List   Diagnosis Date Noted   Generalized hyperreflexia 06/25/2019   Intractable persistent migraine aura without cerebral infarction and with status migrainosus 06/25/2019   Chronic migraine with aura 06/25/2019   Ventral hernia without obstruction or gangrene 05/13/2018   IBS (irritable bowel syndrome) 09/26/2017   Preventative health care 12/02/2016   History of rectal bleeding 09/06/2016   Abnormal loss of weight 07/15/2016   Muscle cramps 02/12/2016   Joint crepitus 12/29/2015   Raynaud phenomenon 12/29/2015   Unexplained night sweats 12/29/2015   Heart murmur previously undiagnosed 03/29/2015   Oral contraceptive pill surveillance 03/29/2015   Hypersomnia with long sleep time, idiopathic 08/02/2014   Major depressive disorder, recurrent, in remission (HCC) 08/02/2014    Past Surgical History:  Procedure Laterality Date   WISDOM TOOTH EXTRACTION Bilateral 11/22/2016    OB History   No obstetric history on file.      Home Medications    Prior to Admission medications   Medication Sig Start Date End Date Taking? Authorizing  Provider  ondansetron (ZOFRAN-ODT) 4 MG disintegrating tablet Take 1 tablet (4 mg total) by mouth every 8 (eight) hours as needed for nausea or vomiting. 06/23/21  Yes Bing Neighbors, FNP  lamoTRIgine (LAMICTAL) 200 MG tablet Take 1 tablet (200 mg total) by mouth daily. 12/01/17   Kerman Passey, MD  levonorgestrel-ethinyl estradiol (ALESSE) 0.1-20 MG-MCG tablet Take 1 tablet by mouth daily.    [provider]  propranolol (INDERAL) 10 MG tablet Take 1 tablet (10 mg total) by mouth 2 (two) times daily. MUST SCHEDULE AN APPT FOR CONTINUED REFILLS. [phone] 05/14/21   Lomax, Amy, NP  QUEtiapine (SEROQUEL) 50 MG tablet quetiapine 50 mg tablet   1 tablet every day by oral route.    [provider]  sertraline (ZOLOFT) 25 MG tablet Take 25 mg by mouth daily.  04/17/18   [provider]    Family History Family History  Problem Relation Age of Onset   Alzheimer's disease Maternal Grandmother    Heart disease Maternal Grandfather    Alcohol abuse Paternal Aunt    Depression Maternal Aunt     Social History Social History   Tobacco Use   Smoking status: Never   Smokeless tobacco: Never  Vaping Use   Vaping Use: Never used  Substance Use Topics   Alcohol use: No    Alcohol/week: 0.0 standard drinks    Comment: only once a month   Drug use: No     Allergies   Tuberculin ppd tine  test [tuberculin, ppd]; Penicillins; and Zithromax [azithromycin]   Review of Systems Review of Systems Pertinent negatives listed in HPI   Physical Exam Triage Vital Signs ED Triage Vitals  Enc Vitals Group     BP 06/23/21 1320 104/72     Pulse Rate 06/23/21 1320 (!) 125     Resp 06/23/21 1320 18     Temp 06/23/21 1320 100.2 F (37.9 C)     Temp Source 06/23/21 1320 Oral     SpO2 06/23/21 1320 96 %     Weight --      Height --      Head Circumference --      Peak Flow --      Pain Score 06/23/21 1315 4     Pain Loc --      Pain Edu? --      Excl. in [place]? --     No data found.  Updated Vital Signs BP 104/72 (BP Location: Left Arm)    Pulse (!) 125    Temp 100.2 F (37.9 C) (Oral)    Resp 18    LMP  (LMP Unknown)    SpO2 96%   Visual Acuity Right Eye Distance:   Left Eye Distance:   Bilateral Distance:    Right Eye Near:   Left Eye Near:    Bilateral Near:     Physical Exam Constitutional:      Appearance: She is ill-appearing.  HENT:     Head: Normocephalic and atraumatic.     Nose: Nose normal.  Eyes:     Extraocular Movements: Extraocular movements intact.     Pupils: Pupils are equal, round, and reactive to light.  Cardiovascular:     Rate and Rhythm: Regular rhythm. Tachycardia present.  Abdominal:     General: Bowel sounds are increased.     Palpations: Abdomen is soft.     Tenderness: There is no abdominal tenderness.  Musculoskeletal:     Cervical back: Normal range of motion.  Lymphadenopathy:     Cervical: No cervical adenopathy.  Neurological:     Mental Status: She is alert.     GCS: GCS eye subscore is 4. GCS verbal subscore is 5. GCS motor subscore is 6.     Sensory: Sensation is intact.     Coordination: Coordination is intact.  Psychiatric:        Attention and Perception: Attention and perception normal.        Speech: Speech normal.        Behavior: Behavior normal.        Thought Content: Thought content normal.     UC Treatments / Results  Labs (all labs ordered are listed, but only abnormal results are displayed) Labs Reviewed  COVID-19, FLU A+B NAA  POCT INFLUENZA A/B    EKG   Radiology No results found.  Procedures Procedures (including critical care time)  Medications Ordered in UC Medications  metoCLOPramide (REGLAN) injection 5 mg (5 mg Intramuscular Given 06/23/21 1404)    Initial Impression / Assessment and Plan / UC Course  I have reviewed the triage vital signs and the nursing notes.  Pertinent labs & imaging results that were available during my care of the patient were  reviewed by me and considered in my medical decision making (see chart for details).    Patient presents today with acute nausea and vomiting consistent with that of flulike symptoms given patient has a fever.  Patient was given IM Reglan here  in clinic.  She is being discharged home advised to force fluids and take Zofran as prescribed.  Rapid flu negative.  COVID/flu send out is pending.  Recommend isolation given fever and symptoms until test results are known.  ER precautions if symptoms become severe.  Return as needed.  Final Clinical Impressions(s) / UC Diagnoses   Final diagnoses:  Nausea and vomiting, unspecified vomiting type  Flu-like symptoms     Discharge Instructions      Take Zofran as prescribed. Your COVID/Flu results should result within 3  days. Negative results are immediately resulted to Mychart. Positive results will receive a follow-up call from our clinic. If symptoms are present, I recommend home quarantine until results are known.  Alternate Tylenol and ibuprofen as needed for body aches and fever.  Symptom management per recommendations discussed today.  If any breathing difficulty or chest pain develops go immediately to the closest emergency department for evaluation.    ED Prescriptions     Medication Sig Dispense Auth. Provider   ondansetron (ZOFRAN-ODT) 4 MG disintegrating tablet Take 1 tablet (4 mg total) by mouth every 8 (eight) hours as needed for nausea or vomiting. 20 tablet Scot Jun, FNP      PDMP not reviewed this encounter.   Scot Jun, FNP 06/29/21 [phone]

## 2021-06-25 LAB — COVID-19, FLU A+B NAA
Influenza A, NAA: NOT DETECTED
Influenza B, NAA: NOT DETECTED
SARS-CoV-2, NAA: NOT DETECTED

## 2021-09-06 ENCOUNTER — Encounter: Payer: 59 | Admitting: Nurse Practitioner

## 2021-09-10 ENCOUNTER — Encounter: Payer: Self-pay | Admitting: Internal Medicine

## 2021-09-10 ENCOUNTER — Ambulatory Visit: Payer: 59 | Admitting: Internal Medicine

## 2021-09-10 ENCOUNTER — Ambulatory Visit: Payer: Self-pay

## 2021-09-10 VITALS — BP 106/78 | HR 91 | Temp 97.8°F | Resp 16 | Ht 59.0 in | Wt 113.7 lb

## 2021-09-10 DIAGNOSIS — B349 Viral infection, unspecified: Secondary | ICD-10-CM | POA: Diagnosis not present

## 2021-09-10 DIAGNOSIS — R112 Nausea with vomiting, unspecified: Secondary | ICD-10-CM

## 2021-09-10 MED ORDER — ONDANSETRON 4 MG PO TBDP: 4.0000 mg | ORAL_TABLET | Freq: Three times a day (TID) | ORAL | 0 refills | Status: DC | PRN

## 2021-09-10 MED ORDER — PROMETHAZINE HCL 50 MG/ML IJ SOLN: 25.0000 mg | Freq: Four times a day (QID) | INTRAMUSCULAR | Status: DC | PRN

## 2021-09-10 MED ORDER — PROMETHAZINE HCL 25 MG/ML IJ SOLN
25.0000 mg | Freq: Once | INTRAMUSCULAR | Status: AC
  Administered 2021-09-10: 25 mg via INTRAMUSCULAR

## 2021-09-10 MED ORDER — PROMETHAZINE HCL 25 MG PO TABS: 25.0000 mg | ORAL_TABLET | Freq: Once | ORAL | Status: DC

## 2021-09-10 NOTE — Telephone Encounter (Signed)
? ? ? ?  Chief Complaint: Lower abdominal pain ?Symptoms: Vomited x 1 ?Frequency: Started today ?Pertinent Negatives: Patient denies fever ?Disposition: [] ED /[] Urgent Care (no appt availability in office) / [x] Appointment(In office/virtual)/ []  [place] [facility]/ [] Home Care/ [] Refused Recommended Disposition /[] [place] [facility]/ []  Follow-up with PCP ?Additional Notes:   ?Reason for Disposition ? [1] MILD-MODERATE pain AND [2] constant AND [3] present > 2 hours ? ?Answer Assessment - Initial Assessment Questions ?1. LOCATION: "Where does it hurt?"  ?    Below belly button ?2. RADIATION: "Does the pain shoot anywhere else?" (e.g., chest, back) ?   Chest/ribs ?3. ONSET: "When did the pain begin?" (e.g., minutes, hours or days ago)  ?   Today ?4. SUDDEN: "Gradual or sudden onset?" ?    Sudden ?5. PATTERN "Does the pain come and go, or is it constant?" ?   - If constant: "Is it getting better, staying the same, or worsening?"  ?    (Note: Constant means the pain never goes away completely; most serious pain is constant and it progresses)  ?   - If intermittent: "How long does it last?" "Do you have pain now?" ?    (Note: Intermittent means the pain goes away completely between bouts) ?    Comes and goes ?6. SEVERITY: "How bad is the pain?"  (e.g., Scale 1-10; mild, moderate, or severe) ?  - MILD (1-3): doesn't interfere with normal activities, abdomen soft and not tender to touch  ?  - MODERATE (4-7): interferes with normal activities or awakens from sleep, abdomen tender to touch  ?  - SEVERE (8-10): excruciating pain, doubled over, unable to do any normal activities  ?    Now - 8 ?7. RECURRENT SYMPTOM: "Have you ever had this type of stomach pain before?" If Yes, ask: "When was the last time?" and "What happened that time?"  ?    No ?8. CAUSE: "What do you think is causing the stomach pain?" ?    Unsure ?9. RELIEVING/AGGRAVATING FACTORS: "What makes it better or worse?" (e.g., movement, antacids, bowel  movement) ?    No ?10. OTHER SYMPTOMS: "Do you have any other symptoms?" (e.g., back pain, diarrhea, fever, urination pain, vomiting) ?      Vomited x 1  ?11. PREGNANCY: "Is there any chance you are pregnant?" "When was your last menstrual period?" ?      No ? ?Protocols used: Abdominal Pain - Female-A-AH ? ?

## 2021-09-10 NOTE — Patient Instructions (Addendum)
It was great seeing you today! ? ?Plan discussed at today's visit: ?-Medicine given today to help with nausea and will make you sleepy ?-Medication sent to pharmacy to take under the tongue to help with nausea as well  ?-If your abdominal pain becomes severe or vomiting becomes more frequent, please go to the ER for prompt evaluation  ? ?Follow up in: as needed  ? ?Take care and let us know if you have any questions or concerns prior to your next visit. ? ?Dr. Rosana Berger ? ?

## 2021-09-10 NOTE — Progress Notes (Signed)
? ?Acute Office Visit ? ?Subjective:  ? ? Patient ID: Elizabeth Velez, female    DOB: Oct 20, 1995, 26 y.o.   MRN: [id number] ? ?Chief Complaint  ?Patient presents with  ? Abdominal Pain  ? ? ?HPI ?Patient is in today for abdominal pain x 1 day. Denies sick contacts, eating any abnormal foods.  ? ?Abdominal Pain: ?-Duration: 2 hours ago  ?-Onset: sudden ?-Quality: sharp ?-Location:  LLQ and RLQ  ?Episode duration:  ?-Radiation: yes to ribs  ?-Frequency:  constant dull ache and then in episodes sharp pain - lasts 1-2 minutes  ?-Status: worse ?-Treatments attempted: none ?-Fever: no ?-Nausea: yes ?-Vomiting: yes ?-Decreased appetite: yes ?-Diarrhea: yes ?-Dysuria/urinary frequency: no ?-Hematuria: no ?-No fevers  ? ?Past Medical History:  ?Diagnosis Date  ? Anxiety   ? Bipolar 1 disorder, depressed (HCC)   ? Depression   ? Depression 08/02/2014  ? Excessive daytime sleepiness 08/02/2014  ? Hypersomnia with long sleep time, idiopathic 08/02/2014  ? Migraine   ? Ventral hernia without obstruction or gangrene 05/13/2018  ? CT scan Nov 2019  ? ? ?Past Surgical History:  ?Procedure Laterality Date  ? WISDOM TOOTH EXTRACTION Bilateral 11/22/2016  ? ? ?Family History  ?Problem Relation Age of Onset  ? Alzheimer's disease Maternal Grandmother   ? Heart disease Maternal Grandfather   ? Alcohol abuse Paternal Aunt   ? Depression Maternal Aunt   ? ? ?Social History  ? ?Socioeconomic History  ? Marital status: Significant Other  ?  Spouse name: Not on file  ? Number of children: 0  ? Years of education: 3417  ? Highest education level: Bachelor's degree (e.g., BA, AB, BS)  ?Occupational History  ? Occupation: 1st Grade Teacher  ?Tobacco Use  ? Smoking status: Never  ? Smokeless tobacco: Never  ?Vaping Use  ? Vaping Use: Never used  ?Substance and Sexual Activity  ? Alcohol use: No  ?  Alcohol/week: 0.0 standard drinks  ?  Comment: only once a month  ? Drug use: No  ? Sexual activity: Yes  ?  Partners: Male  ?  Birth control/protection:  Condom, Pill  ?Other Topics Concern  ? Not on file  ?Social History Narrative  ? Caffeine, sporadic  ? ?Social Determinants of Health  ? ?Financial Resource Strain: Not on file  ?Food Insecurity: Not on file  ?Transportation Needs: Not on file  ?Physical Activity: Not on file  ?Stress: Not on file  ?Social Connections: Not on file  ?Intimate Partner Violence: Not on file  ? ? ?Outpatient Medications Prior to Visit  ?Medication Sig Dispense Refill  ? lamoTRIgine (LAMICTAL) 200 MG tablet Take 1 tablet (200 mg total) by mouth daily. 90 tablet 0  ? levonorgestrel-ethinyl estradiol (ALESSE) 0.1-20 MG-MCG tablet Take 1 tablet by mouth daily.    ? ondansetron (ZOFRAN-ODT) 4 MG disintegrating tablet Take 1 tablet (4 mg total) by mouth every 8 (eight) hours as needed for nausea or vomiting. 20 tablet 0  ? propranolol (INDERAL) 10 MG tablet Take 1 tablet (10 mg total) by mouth 2 (two) times daily. MUST SCHEDULE AN APPT FOR CONTINUED REFILLS. [phone] 30 tablet 0  ? QUEtiapine (SEROQUEL) 50 MG tablet quetiapine 50 mg tablet ?  1 tablet every day by oral route.    ? sertraline (ZOLOFT) 25 MG tablet Take 25 mg by mouth daily.     ? ?No facility-administered medications prior to visit.  ? ? ?Allergies  ?Allergen Reactions  ? Tuberculin Ppd Tine Test Scott[Tuberculin,  Ppd] Rash  ?  Skin color changes with bubbling with urticaria   ? Penicillins   ? Zithromax [Azithromycin]   ? ? ?Review of Systems  ?Constitutional:  Negative for chills and fever.  ?Gastrointestinal:  Positive for abdominal pain, diarrhea, nausea and vomiting.  ?Genitourinary:  Negative for dysuria, hematuria and menstrual problem.  ? ?   ?Objective:  ?  ?Physical Exam ?Constitutional:   ?   Appearance: She is well-developed. She is ill-appearing.  ?   Comments: Vomiting in room  ?HENT:  ?   Head: Normocephalic and atraumatic.  ?Eyes:  ?   Conjunctiva/sclera: Conjunctivae normal.  ?Cardiovascular:  ?   Rate and Rhythm: Regular rhythm. Tachycardia present.   ?Pulmonary:  ?   Effort: Pulmonary effort is normal.  ?   Breath sounds: Normal breath sounds.  ?Abdominal:  ?   General: Bowel sounds are normal. There is no distension.  ?   Palpations: Abdomen is soft.  ?   Tenderness: There is abdominal tenderness. There is no right CVA tenderness, left CVA tenderness, guarding or rebound.  ?   Comments: Bilateral lower quadrant tenderness  ?Musculoskeletal:  ?   Right lower leg: No edema.  ?   Left lower leg: No edema.  ?Skin: ?   General: Skin is warm and dry.  ?Neurological:  ?   General: No focal deficit present.  ?   Mental Status: She is alert. Mental status is at baseline.  ? ? ?BP 106/78   Pulse 91   Temp 97.8 ?F (36.6 ?C)   Resp 16   Ht 4\' 11"  (1.499 m)   Wt 113 lb 11.2 oz (51.6 kg)   SpO2 99%   BMI 22.96 kg/m?  ?Wt Readings from Last 3 Encounters:  ?09/10/21 113 lb 11.2 oz (51.6 kg)  ?06/04/21 119 lb (54 kg)  ?09/23/19 123 lb 3.2 oz (55.9 kg)  ? ? ?Health Maintenance Due  ?Topic Date Due  ? Hepatitis C Screening  Never done  ? PAP-Cervical Cytology Screening  Never done  ? PAP SMEAR-Modifier  03/05/2021  ? ? ?There are no preventive care reminders to display for this patient. ? ? ?Lab Results  ?Component Value Date  ? TSH 1.30 12/08/2015  ? ?Lab Results  ?Component Value Date  ? WBC 7.3 05/13/2018  ? HGB 14.4 05/13/2018  ? HCT 42.2 05/13/2018  ? MCV 96.1 05/13/2018  ? PLT 226 05/13/2018  ? ?Lab Results  ?Component Value Date  ? NA 138 06/25/2019  ? K 4.6 06/25/2019  ? CO2 24 06/25/2019  ? GLUCOSE 83 06/25/2019  ? BUN 10 06/25/2019  ? CREATININE 0.72 06/25/2019  ? BILITOT 0.3 06/25/2019  ? ALKPHOS 43 06/25/2019  ? AST 21 06/25/2019  ? ALT 12 06/25/2019  ? PROT 6.3 06/25/2019  ? ALBUMIN 4.1 06/25/2019  ? CALCIUM 9.2 06/25/2019  ? ANIONGAP 10 05/13/2018  ? ?Lab Results  ?Component Value Date  ? CHOL 147 03/29/2015  ? ?Lab Results  ?Component Value Date  ? HDL 55 03/29/2015  ? ?Lab Results  ?Component Value Date  ? LDLCALC 80 03/29/2015  ? ?Lab Results  ?Component  Value Date  ? TRIG 61 03/29/2015  ? ?Lab Results  ?Component Value Date  ? CHOLHDL 2.7 03/29/2015  ? ?No results found for: HGBA1C ? ?   ?Assessment & Plan:  ? ?1. Viral illness/Nausea and vomiting, unspecified vomiting type: Abdominal pain, nausea/vomiting started about 2 hours ago. Vital signs and physical exam reassuring. Vomiting  increasing in frequency, will given IM Phenergan here to make her more comfortable and will send Zofran PRN as well. Recommend rest, PO fluids as tolerated. If pain becomes severe or if she cannot keep anything down despite medication, recommend going to ER for evaluation.  ? ?- ondansetron (ZOFRAN-ODT) 4 MG disintegrating tablet; Take 1 tablet (4 mg total) by mouth every 8 (eight) hours as needed for nausea or vomiting.  Dispense: 20 tablet; Refill: 0 ? ?Margarita Mail, DO ? ?

## 2021-09-26 ENCOUNTER — Other Ambulatory Visit: Payer: Self-pay

## 2021-09-26 ENCOUNTER — Encounter: Payer: Self-pay | Admitting: Nurse Practitioner

## 2021-09-26 ENCOUNTER — Ambulatory Visit (INDEPENDENT_AMBULATORY_CARE_PROVIDER_SITE_OTHER): Payer: 59 | Admitting: Nurse Practitioner

## 2021-09-26 VITALS — BP 118/78 | HR 94 | Temp 98.4°F | Resp 18 | Ht 59.0 in | Wt 111.3 lb

## 2021-09-26 DIAGNOSIS — Z0000 Encounter for general adult medical examination without abnormal findings: Secondary | ICD-10-CM

## 2021-09-26 NOTE — Progress Notes (Signed)
Name: Elizabeth Velez   MRN: [id number]    DOB: 03/05/1996   Date:09/26/2021 ? ?     Progress Note ? ?Subjective ? ?Chief Complaint ? ?Chief Complaint  ?Patient presents with  ? Annual Exam  ? ? ?HPI ? ?Patient presents for annual CPE. ? ?Diet: Well balanced diet, sometimes does not have an appetite due to busy lifestyle ?Exercise: she says she does not have time she is in grad school.  ?Sleep: she says she maybe gets 5-6 hours a night ? ?[place] Office Visit from 09/26/2021 in [facility]  ?AUDIT-C Score 0  ? ?  ? ?Depression: Phq 9 is  negative ? ?  09/26/2021  ?  8:29 AM 09/10/2021  ?  2:11 PM 06/04/2021  ? 11:28 AM 05/21/2019  ?  9:34 AM 08/24/2018  ? 11:58 AM  ?Depression screen PHQ 2/9  ?Decreased Interest 0 0 0 0 0  ?Down, Depressed, Hopeless 0 0 0 0 0  ?PHQ - 2 Score 0 0 0 0 0  ?Altered sleeping 0 0 0 0 0  ?Tired, decreased energy 1 0 0 1 0  ?Change in appetite 1 0 0 0 0  ?Feeling bad or failure about yourself  0 0 0 0 0  ?Trouble concentrating 1 0 0 0 0  ?Moving slowly or fidgety/restless 0 0 0 0 0  ?Suicidal thoughts 0 0 0 0 0  ?PHQ-9 Score 3 0 0 1 0  ?Difficult doing work/chores Not difficult at all Not difficult at all Not difficult at all Not difficult at all Not difficult at all  ? ?Hypertension: ?BP Readings from Last 3 Encounters:  ?09/26/21 118/78  ?09/10/21 106/78  ?06/23/21 104/72  ? ?Obesity: ?Wt Readings from Last 3 Encounters:  ?09/26/21 111 lb 4.8 oz (50.5 kg)  ?09/10/21 113 lb 11.2 oz (51.6 kg)  ?06/04/21 119 lb (54 kg)  ? ?BMI Readings from Last 3 Encounters:  ?09/26/21 22.48 kg/m?  ?09/10/21 22.96 kg/m?  ?06/04/21 24.04 kg/m?  ?  ? ?Vaccines:  ?HPV: up to at age 47 , ask insurance if age between 31-45  ?Shingrix: 34-64 yo and ask insurance if covered when patient above 24 yo ?Pneumonia:  educated and discussed with patient. ?Flu:  educated and discussed with patient. ? ?Hep C Screening: due but getting blood work on Friday with GYN ?STD testing and prevention  (HIV/chl/gon/syphilis): 12/08/2015 ?Intimate partner violence: none ?Sexual History : yes ?Menstrual History/LMP/Abnormal Bleeding: does not have a cycle on birth control  ?Incontinence Symptoms: none ? ?Breast cancer:  ?- Last Mammogram: no concerns, does not qualify, recent breast exam done last week at GYN ?- BRCA gene screening: none ? ?Osteoporosis: Discussed high calcium and vitamin D supplementation, weight bearing exercises ? ?Cervical cancer screening: 07/06/2020 ? ?Skin cancer: Discussed monitoring for atypical lesions  ?Colorectal cancer: no concerns, does not qualify ?Lung cancer:   Low Dose CT Chest recommended if Age 109-80 years, 20 pack-year currently smoking OR have quit w/in 15years. Patient does not qualify.   ?ECG: 03/29/2015 ? ?Advanced Care Planning: A voluntary discussion about advance care planning including the explanation and discussion of advance directives.  Discussed health care proxy and Living will, and the patient was able to identify a health care proxy as husband, Anastasia Pall.  Patient does not have a living will at present time. If patient does have living will, I have requested they bring this to the clinic to be scanned in to their chart. ? ?Lipids: ?  Lab Results  ?Component Value Date  ? CHOL 147 03/29/2015  ? ?Lab Results  ?Component Value Date  ? HDL 55 03/29/2015  ? ?Lab Results  ?Component Value Date  ? [place] 80 03/29/2015  ? ?Lab Results  ?Component Value Date  ? TRIG 61 03/29/2015  ? ?Lab Results  ?Component Value Date  ? CHOLHDL 2.7 03/29/2015  ? ?No results found for: LDLDIRECT ? ?Glucose: ?Glucose  ?Date Value Ref Range Status  ?06/25/2019 83 65 - 99 mg/dL Final  ?10/10/2013 91 65 - 99 mg/dL Final  ? ?Glucose, Bld  ?Date Value Ref Range Status  ?05/13/2018 98 70 - 99 mg/dL Final  ?05/08/2018 91 65 - 139 mg/dL Final  ?  Comment:  ?  . ?       Non-fasting reference interval ?. ?  ?09/26/2017 86 65 - 99 mg/dL Final  ?  Comment:  ?  . ?           Fasting reference  interval ?. ?  ? ? ?Patient Active Problem List  ? Diagnosis Date Noted  ? Generalized hyperreflexia 06/25/2019  ? Intractable persistent migraine aura without cerebral infarction and with status migrainosus 06/25/2019  ? Chronic migraine with aura 06/25/2019  ? Ventral hernia without obstruction or gangrene 05/13/2018  ? IBS (irritable bowel syndrome) 09/26/2017  ? Preventative health care 12/02/2016  ? History of rectal bleeding 09/06/2016  ? Abnormal loss of weight 07/15/2016  ? Muscle cramps 02/12/2016  ? Joint crepitus 12/29/2015  ? Raynaud phenomenon 12/29/2015  ? Unexplained night sweats 12/29/2015  ? Heart murmur previously undiagnosed 03/29/2015  ? Oral contraceptive pill surveillance 03/29/2015  ? Hypersomnia with long sleep time, idiopathic 08/02/2014  ? Major depressive disorder, recurrent, in remission ([place]) 08/02/2014  ? ? ?Past Surgical History:  ?Procedure Laterality Date  ? WISDOM TOOTH EXTRACTION Bilateral 11/22/2016  ? ? ?Family History  ?Problem Relation Age of Onset  ? Alzheimer's disease Maternal Grandmother   ? Heart disease Maternal Grandfather   ? Alcohol abuse Paternal Aunt   ? Depression Maternal Aunt   ? ? ?Social History  ? ?Socioeconomic History  ? Marital status: Significant Other  ?  Spouse name: Not on file  ? Number of children: 0  ? Years of education: 34  ? Highest education level: Bachelor's degree (e.g., BA, AB, BS)  ?Occupational History  ? Occupation: 1st Grade Teacher  ?Tobacco Use  ? Smoking status: Never  ? Smokeless tobacco: Never  ?Vaping Use  ? Vaping Use: Never used  ?Substance and Sexual Activity  ? Alcohol use: No  ?  Alcohol/week: 0.0 standard drinks  ?  Comment: only once a month  ? Drug use: No  ? Sexual activity: Yes  ?  Partners: Male  ?  Birth control/protection: Condom, Pill  ?Other Topics Concern  ? Not on file  ?Social History Narrative  ? Caffeine, sporadic  ? ?Social Determinants of Health  ? ?Financial Resource Strain: Not on file  ?Food Insecurity: Not on  file  ?Transportation Needs: No Transportation Needs  ? Lack of Transportation (Medical): No  ? Lack of Transportation (Non-Medical): No  ?Physical Activity: Inactive  ? Days of Exercise per Week: 0 days  ? Minutes of Exercise per Session: 0 min  ?Stress: Not on file  ?Social Connections: Not on file  ?Intimate Partner Violence: Not At Risk  ? Fear of Current or Ex-Partner: No  ? Emotionally Abused: No  ? Physically Abused: No  ? Sexually  Abused: No  ? ? ? ?Current Outpatient Medications:  ?  lamoTRIgine (LAMICTAL) 200 MG tablet, Take 1 tablet (200 mg total) by mouth daily., Disp: 90 tablet, Rfl: 0 ?  levonorgestrel-ethinyl estradiol (ALESSE) 0.1-20 MG-MCG tablet, Take 1 tablet by mouth daily., Disp: , Rfl:  ?  propranolol (INDERAL) 10 MG tablet, Take 1 tablet (10 mg total) by mouth 2 (two) times daily. MUST SCHEDULE AN APPT FOR CONTINUED REFILLS. [phone], Disp: 30 tablet, Rfl: 0 ?  QUEtiapine (SEROQUEL) 50 MG tablet, quetiapine 50 mg tablet   1 tablet every day by oral route., Disp: , Rfl:  ?  sertraline (ZOLOFT) 25 MG tablet, Take 25 mg by mouth daily. , Disp: , Rfl:  ?  ondansetron (ZOFRAN-ODT) 4 MG disintegrating tablet, Take 1 tablet (4 mg total) by mouth every 8 (eight) hours as needed for nausea or vomiting. (Patient not taking: Reported on 09/26/2021), Disp: 20 tablet, Rfl: 0 ? ?Allergies  ?Allergen Reactions  ? Tuberculin Ppd Tine Test [Tuberculin, Ppd] Rash  ?  Skin color changes with bubbling with urticaria   ? Penicillins   ? Zithromax [Azithromycin]   ? ? ? ?ROS ? ?Constitutional: Negative for fever or weight change.  ?Respiratory: Negative for cough and shortness of breath.   ?Cardiovascular: Negative for chest pain or palpitations.  ?Gastrointestinal: Negative for abdominal pain, no bowel changes.  ?Musculoskeletal: Negative for gait problem or joint swelling.  ?Skin: Negative for rash.  ?Neurological: Negative for dizziness or headache.  ?No other specific complaints in a complete review of  systems (except as listed in HPI above).  ? ?Objective ? ?Vitals:  ? 09/26/21 0827  ?BP: 118/78  ?Pulse: 94  ?Resp: 18  ?Temp: 98.4 ?F (36.9 ?C)  ?TempSrc: Oral  ?SpO2: 98%  ?Weight: 111 lb 4.8 oz (50.5 kg)  ?Height: $RemoveB'4\' 11"'IwxnlLxM$  (1.

## 2022-05-01 ENCOUNTER — Telehealth: Payer: BC Managed Care – PPO | Admitting: Physician Assistant

## 2022-05-01 DIAGNOSIS — B9689 Other specified bacterial agents as the cause of diseases classified elsewhere: Secondary | ICD-10-CM | POA: Diagnosis not present

## 2022-05-01 DIAGNOSIS — J019 Acute sinusitis, unspecified: Secondary | ICD-10-CM | POA: Diagnosis not present

## 2022-05-01 MED ORDER — FLUTICASONE PROPIONATE 50 MCG/ACT NA SUSP: 2.0000 | Freq: Every day | NASAL | 0 refills | Status: AC

## 2022-05-01 MED ORDER — DOXYCYCLINE HYCLATE 100 MG PO TABS: 100.0000 mg | ORAL_TABLET | Freq: Two times a day (BID) | ORAL | 0 refills | Status: AC

## 2022-05-01 NOTE — Patient Instructions (Signed)
Silvestre Mesi, thank you for joining Leeanne Rio, PA-C for today's virtual visit.  While this provider is not your primary care provider (PCP), if your PCP is located in our provider database this encounter information will be shared with them immediately following your visit.   [facility] account gives you access to today's visit and all your visits, tests, and labs performed at [facility] " click here if you don't have a [facility] account or go to [email or web address]  Consent: (Patient) Elizabeth Velez provided verbal consent for this virtual visit at the beginning of the encounter.  Current Medications:  Current Outpatient Medications:    lamoTRIgine (LAMICTAL) 200 MG tablet, Take 1 tablet (200 mg total) by mouth daily., Disp: 90 tablet, Rfl: 0   levonorgestrel-ethinyl estradiol (ALESSE) 0.1-20 MG-MCG tablet, Take 1 tablet by mouth daily., Disp: , Rfl:    ondansetron (ZOFRAN-ODT) 4 MG disintegrating tablet, Take 1 tablet (4 mg total) by mouth every 8 (eight) hours as needed for nausea or vomiting. (Patient not taking: Reported on 09/26/2021), Disp: 20 tablet, Rfl: 0   propranolol (INDERAL) 10 MG tablet, Take 1 tablet (10 mg total) by mouth 2 (two) times daily. MUST SCHEDULE AN APPT FOR CONTINUED REFILLS. [phone], Disp: 30 tablet, Rfl: 0   QUEtiapine (SEROQUEL) 50 MG tablet, quetiapine 50 mg tablet   1 tablet every day by oral route., Disp: , Rfl:    sertraline (ZOLOFT) 25 MG tablet, Take 25 mg by mouth daily. , Disp: , Rfl:    Medications ordered in this encounter:  No orders of the defined types were placed in this encounter.    *If you need refills on other medications prior to your next appointment, please contact your pharmacy*  Follow-Up: Call back or seek an in-person evaluation if the symptoms worsen or if the condition fails to improve as anticipated.  Maish Vaya [phone]  Other Instructions Please  take antibiotic as directed.  Increase fluid intake.  Use Saline nasal spray.  Take a daily multivitamin. Use Flonase as directed.  Place a humidifier in the bedroom.  Please call or return clinic if symptoms are not improving.  Sinusitis Sinusitis is redness, soreness, and swelling (inflammation) of the paranasal sinuses. Paranasal sinuses are air pockets within the bones of your face (beneath the eyes, the middle of the forehead, or above the eyes). In healthy paranasal sinuses, mucus is able to drain out, and air is able to circulate through them by way of your nose. However, when your paranasal sinuses are inflamed, mucus and air can become trapped. This can allow bacteria and other germs to grow and cause infection. Sinusitis can develop quickly and last only a short time (acute) or continue over a long period (chronic). Sinusitis that lasts for more than 12 weeks is considered chronic.  CAUSES  Causes of sinusitis include: Allergies. Structural abnormalities, such as displacement of the cartilage that separates your nostrils (deviated septum), which can decrease the air flow through your nose and sinuses and affect sinus drainage. Functional abnormalities, such as when the small hairs (cilia) that line your sinuses and help remove mucus do not work properly or are not present. SYMPTOMS  Symptoms of acute and chronic sinusitis are the same. The primary symptoms are pain and pressure around the affected sinuses. Other symptoms include: Upper toothache. Earache. Headache. Bad breath. Decreased sense of smell and taste. A cough, which worsens when you are lying flat. Fatigue. Fever. Thick  drainage from your nose, which often is green and may contain pus (purulent). Swelling and warmth over the affected sinuses. DIAGNOSIS  Your caregiver will perform a physical exam. During the exam, your caregiver may: Look in your nose for signs of abnormal growths in your nostrils (nasal polyps). Tap  over the affected sinus to check for signs of infection. View the inside of your sinuses (endoscopy) with a special imaging device with a light attached (endoscope), which is inserted into your sinuses. If your caregiver suspects that you have chronic sinusitis, one or more of the following tests may be recommended: Allergy tests. Nasal culture A sample of mucus is taken from your nose and sent to a lab and screened for bacteria. Nasal cytology A sample of mucus is taken from your nose and examined by your caregiver to determine if your sinusitis is related to an allergy. TREATMENT  Most cases of acute sinusitis are related to a viral infection and will resolve on their own within 10 days. Sometimes medicines are prescribed to help relieve symptoms (pain medicine, decongestants, nasal steroid sprays, or saline sprays).  However, for sinusitis related to a bacterial infection, your caregiver will prescribe antibiotic medicines. These are medicines that will help kill the bacteria causing the infection.  Rarely, sinusitis is caused by a fungal infection. In theses cases, your caregiver will prescribe antifungal medicine. For some cases of chronic sinusitis, surgery is needed. Generally, these are cases in which sinusitis recurs more than 3 times per year, despite other treatments. HOME CARE INSTRUCTIONS  Drink plenty of water. Water helps thin the mucus so your sinuses can drain more easily. Use a humidifier. Inhale steam 3 to 4 times a day (for example, sit in the bathroom with the shower running). Apply a warm, moist washcloth to your face 3 to 4 times a day, or as directed by your caregiver. Use saline nasal sprays to help moisten and clean your sinuses. Take over-the-counter or prescription medicines for pain, discomfort, or fever only as directed by your caregiver. SEEK IMMEDIATE MEDICAL CARE IF: You have increasing pain or severe headaches. You have nausea, vomiting, or drowsiness. You have  swelling around your face. You have vision problems. You have a stiff neck. You have difficulty breathing. MAKE SURE YOU:  Understand these instructions. Will watch your condition. Will get help right away if you are not doing well or get worse. Document Released: 06/03/2005 Document Revised: 08/26/2011 Document Reviewed: 06/18/2011 [facility] Patient Information 2014 [place], [place].    If you have been instructed to have an in-person evaluation today at a local Urgent Care facility, please use the link below. It will take you to a list of all of our available [place] [facility], including address, phone number and hours of operation. Please do not delay care.  [facility]  If you or a family member do not have a primary care provider, use the link below to schedule a visit and establish care. When you choose a [facility] primary care physician or advanced practice provider, you gain a long-term partner in health. Find a Primary Care Provider  Learn more about [facility]'s in-office and virtual care options:  - Get Care Now

## 2022-05-01 NOTE — Progress Notes (Signed)
Virtual Visit Consent   EDRIE EHRICH, you are scheduled for a virtual visit with a [facility] provider today. Just as with appointments in the office, your consent must be obtained to participate. Your consent will be active for this visit and any virtual visit you may have with one of our providers in the next 365 days. If you have a MyChart account, a copy of this consent can be sent to you electronically.  As this is a virtual visit, video technology does not allow for your provider to perform a traditional examination. This may limit your provider's ability to fully assess your condition. If your provider identifies any concerns that need to be evaluated in person or the need to arrange testing (such as labs, EKG, etc.), we will make arrangements to do so. Although advances in technology are sophisticated, we cannot ensure that it will always work on either your end or our end. If the connection with a video visit is poor, the visit may have to be switched to a telephone visit. With either a video or telephone visit, we are not always able to ensure that we have a secure connection.  By engaging in this virtual visit, you consent to the provision of healthcare and authorize for your insurance to be billed (if applicable) for the services provided during this visit. Depending on your insurance coverage, you may receive a charge related to this service.  I need to obtain your verbal consent now. Are you willing to proceed with your visit today? Elizabeth Velez has provided verbal consent on 05/01/2022 for a virtual visit (video or telephone). Piedad Climes, [place]  Date: 05/01/2022 8:36 AM  Virtual Visit via Video Note   I, Piedad Climes, connected with  Elizabeth Velez  ([id number], 1996-01-06) on 05/01/22 at  8:00 AM EST by a video-enabled telemedicine application and verified that I am speaking with the correct person using two identifiers.  Location: Patient: Virtual Visit  Location Patient: Home Provider: Virtual Visit Location Provider: Home Office   I discussed the limitations of evaluation and management by telemedicine and the availability of in person appointments. The patient expressed understanding and agreed to proceed.    History of Present Illness: Elizabeth Velez is a 26 y.o. who identifies as a female who was assigned female at birth, and is being seen today for week or so of head and nasal congestion with sore throat and cough. Has mild aches initially but resolved quickly. Denies fever, chest pain or SOB. Cough that is mainly dry. No with sinus pain and pain of upper teeth. Thick nasal discharge.   HPI: HPI  Problems:  Patient Active Problem List   Diagnosis Date Noted   Generalized hyperreflexia 06/25/2019   Intractable persistent migraine aura without cerebral infarction and with status migrainosus 06/25/2019   Chronic migraine with aura 06/25/2019   Ventral hernia without obstruction or gangrene 05/13/2018   IBS (irritable bowel syndrome) 09/26/2017   Preventative health care 12/02/2016   History of rectal bleeding 09/06/2016   Abnormal loss of weight 07/15/2016   Muscle cramps 02/12/2016   Joint crepitus 12/29/2015   Raynaud phenomenon 12/29/2015   Unexplained night sweats 12/29/2015   Heart murmur previously undiagnosed 03/29/2015   Oral contraceptive pill surveillance 03/29/2015   Hypersomnia with long sleep time, idiopathic 08/02/2014   Major depressive disorder, recurrent, in remission (HCC) 08/02/2014    Allergies:  Allergies  Allergen Reactions   Tuberculin Ppd Tine Test [Tuberculin, Ppd]  Rash    Skin color changes with bubbling with urticaria    Penicillins    Zithromax [Azithromycin]    Medications:  Current Outpatient Medications:    doxycycline (VIBRA-TABS) 100 MG tablet, Take 1 tablet (100 mg total) by mouth 2 (two) times daily., Disp: 20 tablet, Rfl: 0   fluticasone (FLONASE) 50 MCG/ACT nasal spray, Place 2 sprays  into both nostrils daily., Disp: 16 g, Rfl: 0   lamoTRIgine (LAMICTAL) 25 MG tablet, Take 25 mg by mouth daily., Disp: , Rfl:    lamoTRIgine (LAMICTAL) 200 MG tablet, Take 1 tablet (200 mg total) by mouth daily., Disp: 90 tablet, Rfl: 0   levonorgestrel-ethinyl estradiol (ALESSE) 0.1-20 MG-MCG tablet, Take 1 tablet by mouth daily., Disp: , Rfl:    propranolol (INDERAL) 10 MG tablet, Take 1 tablet (10 mg total) by mouth 2 (two) times daily. MUST SCHEDULE AN APPT FOR CONTINUED REFILLS. [phone], Disp: 30 tablet, Rfl: 0   QUEtiapine (SEROQUEL) 50 MG tablet, quetiapine 50 mg tablet   1 tablet every day by oral route., Disp: , Rfl:    sertraline (ZOLOFT) 25 MG tablet, Take 25 mg by mouth daily. , Disp: , Rfl:   Observations/Objective: Patient is well-developed, well-nourished in no acute distress.  Resting comfortably at home.  Head is normocephalic, atraumatic.  No labored breathing. Speech is clear and coherent with logical content.  Patient is alert and oriented at baseline.   Assessment and Plan: 1. Acute bacterial sinusitis - fluticasone (FLONASE) 50 MCG/ACT nasal spray; Place 2 sprays into both nostrils daily.  Dispense: 16 g; Refill: 0 - doxycycline (VIBRA-TABS) 100 MG tablet; Take 1 tablet (100 mg total) by mouth 2 (two) times daily.  Dispense: 20 tablet; Refill: 0  Rx Doxycycline.  Increase fluids.  Rest.  Saline nasal spray.  Probiotic.  Mucinex as directed.  Humidifier in bedroom. FLonase per orders.  Call or return to clinic if symptoms are not improving.   Follow Up Instructions: I discussed the assessment and treatment plan with the patient. The patient was provided an opportunity to ask questions and all were answered. The patient agreed with the plan and demonstrated an understanding of the instructions.  A copy of instructions were sent to the patient via MyChart unless otherwise noted below.    The patient was advised to call back or seek an in-person evaluation if the  symptoms worsen or if the condition fails to improve as anticipated.  Time:  I spent 10 minutes with the patient via telehealth technology discussing the above problems/concerns.    Piedad Climes, PA-C

## 2022-09-30 ENCOUNTER — Encounter: Payer: 59 | Admitting: Nurse Practitioner
